# Patient Record
Sex: Female | Born: 1970 | Race: White | Hispanic: No | Marital: Married | Smoking: Former smoker
Health system: Southern US, Community
[De-identification: ages and names within clinical notes are randomized; demographics above are authoritative.]

## PROBLEM LIST (undated history)

## (undated) DIAGNOSIS — R002 Palpitations: Secondary | ICD-10-CM

## (undated) DIAGNOSIS — K219 Gastro-esophageal reflux disease without esophagitis: Secondary | ICD-10-CM

## (undated) DIAGNOSIS — N63 Unspecified lump in unspecified breast: Secondary | ICD-10-CM

## (undated) DIAGNOSIS — E079 Disorder of thyroid, unspecified: Secondary | ICD-10-CM

## (undated) DIAGNOSIS — F32A Depression, unspecified: Secondary | ICD-10-CM

## (undated) DIAGNOSIS — K8020 Calculus of gallbladder without cholecystitis without obstruction: Secondary | ICD-10-CM

## (undated) DIAGNOSIS — R42 Dizziness and giddiness: Secondary | ICD-10-CM

## (undated) DIAGNOSIS — F329 Major depressive disorder, single episode, unspecified: Secondary | ICD-10-CM

## (undated) DIAGNOSIS — F4310 Post-traumatic stress disorder, unspecified: Secondary | ICD-10-CM

## (undated) DIAGNOSIS — I10 Essential (primary) hypertension: Secondary | ICD-10-CM

## (undated) DIAGNOSIS — M62838 Other muscle spasm: Secondary | ICD-10-CM

## (undated) HISTORY — DX: Major depressive disorder, single episode, unspecified: F32.9

## (undated) HISTORY — DX: Calculus of gallbladder without cholecystitis without obstruction: K80.20

## (undated) HISTORY — DX: Gastro-esophageal reflux disease without esophagitis: K21.9

## (undated) HISTORY — DX: Palpitations: R00.2

## (undated) HISTORY — DX: Unspecified lump in unspecified breast: N63.0

## (undated) HISTORY — DX: Other muscle spasm: M62.838

## (undated) HISTORY — DX: Depression, unspecified: F32.A

## (undated) HISTORY — DX: Disorder of thyroid, unspecified: E07.9

---

## 2004-04-30 HISTORY — PX: BREAST BIOPSY: SHX20

## 2004-04-30 HISTORY — PX: BREAST LUMPECTOMY: SHX2

## 2004-04-30 HISTORY — PX: TUBAL LIGATION: SHX77

## 2005-01-06 ENCOUNTER — Observation Stay: Payer: Self-pay

## 2005-02-20 ENCOUNTER — Observation Stay: Payer: Self-pay

## 2005-02-21 ENCOUNTER — Ambulatory Visit: Payer: Self-pay

## 2005-02-23 ENCOUNTER — Observation Stay: Payer: Self-pay | Admitting: Unknown Physician Specialty

## 2005-03-14 ENCOUNTER — Observation Stay: Payer: Self-pay

## 2005-03-28 ENCOUNTER — Observation Stay: Payer: Self-pay

## 2005-04-16 ENCOUNTER — Observation Stay: Payer: Self-pay | Admitting: Obstetrics & Gynecology

## 2005-04-24 ENCOUNTER — Inpatient Hospital Stay: Payer: Self-pay

## 2005-05-31 ENCOUNTER — Emergency Department: Payer: Self-pay | Admitting: Emergency Medicine

## 2005-10-09 ENCOUNTER — Ambulatory Visit: Payer: Self-pay | Admitting: Unknown Physician Specialty

## 2006-04-13 ENCOUNTER — Emergency Department: Payer: Self-pay | Admitting: Emergency Medicine

## 2011-01-28 ENCOUNTER — Emergency Department: Payer: Self-pay | Admitting: Emergency Medicine

## 2011-01-31 ENCOUNTER — Ambulatory Visit: Payer: Self-pay

## 2011-05-20 ENCOUNTER — Emergency Department: Payer: Self-pay | Admitting: Emergency Medicine

## 2012-04-22 ENCOUNTER — Emergency Department: Payer: Self-pay | Admitting: Internal Medicine

## 2012-04-22 LAB — COMPREHENSIVE METABOLIC PANEL
Albumin: 3.8 g/dL (ref 3.4–5.0)
Anion Gap: 8 (ref 7–16)
BUN: 11 mg/dL (ref 7–18)
Chloride: 106 mmol/L (ref 98–107)
EGFR (African American): >60
Glucose: 98 mg/dL (ref 65–99)
Osmolality: 279 (ref 275–301)
Potassium: 3.8 mmol/L (ref 3.5–5.1)
SGOT(AST): 23 U/L (ref 15–37)
SGPT (ALT): 42 U/L (ref 12–78)
Sodium: 140 mmol/L (ref 136–145)
Total Protein: 7.6 g/dL (ref 6.4–8.2)

## 2012-04-22 LAB — CBC
MCH: 32.9 pg (ref 26.0–34.0)
MCHC: 35.5 g/dL (ref 32.0–36.0)
Platelet: 191 10*3/uL (ref 150–440)
RDW: 13 % (ref 11.5–14.5)
WBC: 7.3 10*3/uL (ref 3.6–11.0)

## 2012-04-22 LAB — URINALYSIS, COMPLETE
Nitrite: NEGATIVE
Ph: 6 (ref 4.5–8.0)
Protein: NEGATIVE
Specific Gravity: 1.027 (ref 1.003–1.030)
WBC UR: 1 /HPF (ref 0–5)

## 2012-04-22 LAB — LIPASE, BLOOD: Lipase: 90 U/L (ref 73–393)

## 2013-08-21 ENCOUNTER — Ambulatory Visit: Payer: Self-pay | Admitting: Family Medicine

## 2013-09-18 ENCOUNTER — Ambulatory Visit: Payer: Self-pay | Admitting: Family Medicine

## 2013-10-07 ENCOUNTER — Ambulatory Visit: Payer: Self-pay

## 2013-10-17 ENCOUNTER — Emergency Department: Payer: Self-pay | Admitting: Emergency Medicine

## 2013-10-17 LAB — CBC WITH DIFFERENTIAL/PLATELET
BASOS ABS: 0 10*3/uL (ref 0.0–0.1)
BASOS PCT: 0.3 %
EOS PCT: 0.2 %
Eosinophil #: 0 10*3/uL (ref 0.0–0.7)
HCT: 43.5 % (ref 35.0–47.0)
HGB: 15 g/dL (ref 12.0–16.0)
LYMPHS PCT: 8.9 %
Lymphocyte #: 1 10*3/uL (ref 1.0–3.6)
MCH: 31.6 pg (ref 26.0–34.0)
MCHC: 34.4 g/dL (ref 32.0–36.0)
MCV: 92 fL (ref 80–100)
MONO ABS: 0.5 x10 3/mm (ref 0.2–0.9)
Monocyte %: 4.5 %
NEUTROS ABS: 10 10*3/uL — AB (ref 1.4–6.5)
Neutrophil %: 86.1 %
PLATELETS: 219 10*3/uL (ref 150–440)
RBC: 4.74 10*6/uL (ref 3.80–5.20)
RDW: 13.3 % (ref 11.5–14.5)
WBC: 11.6 10*3/uL — AB (ref 3.6–11.0)

## 2013-10-17 LAB — URINALYSIS, COMPLETE
Bilirubin,UR: NEGATIVE
Glucose,UR: NEGATIVE mg/dL (ref 0–75)
LEUKOCYTE ESTERASE: NEGATIVE
NITRITE: NEGATIVE
PH: 5 (ref 4.5–8.0)
Protein: 30
RBC,UR: 1 /HPF (ref 0–5)
Specific Gravity: 1.032 (ref 1.003–1.030)
Squamous Epithelial: 3
WBC UR: 3 /HPF (ref 0–5)

## 2013-10-17 LAB — COMPREHENSIVE METABOLIC PANEL
ALK PHOS: 93 U/L
ALT: 25 U/L (ref 12–78)
ANION GAP: 10 (ref 7–16)
Albumin: 4.2 g/dL (ref 3.4–5.0)
BUN: 8 mg/dL (ref 7–18)
Bilirubin,Total: 0.7 mg/dL (ref 0.2–1.0)
Calcium, Total: 9.6 mg/dL (ref 8.5–10.1)
Chloride: 103 mmol/L (ref 98–107)
Co2: 23 mmol/L (ref 21–32)
Creatinine: 0.99 mg/dL (ref 0.60–1.30)
EGFR (Non-African Amer.): >60
Glucose: 83 mg/dL (ref 65–99)
OSMOLALITY: 269 (ref 275–301)
Potassium: 3.7 mmol/L (ref 3.5–5.1)
SGOT(AST): 24 U/L (ref 15–37)
Sodium: 136 mmol/L (ref 136–145)
Total Protein: 8 g/dL (ref 6.4–8.2)

## 2013-10-17 LAB — TROPONIN I

## 2013-10-19 ENCOUNTER — Encounter: Payer: Self-pay | Admitting: *Deleted

## 2013-11-02 ENCOUNTER — Encounter: Payer: Self-pay | Admitting: General Surgery

## 2013-11-02 ENCOUNTER — Ambulatory Visit (INDEPENDENT_AMBULATORY_CARE_PROVIDER_SITE_OTHER): Payer: Self-pay | Admitting: General Surgery

## 2013-11-02 VITALS — BP 140/80 | HR 72 | Resp 14 | Ht 65.0 in | Wt 234.0 lb

## 2013-11-02 DIAGNOSIS — K8020 Calculus of gallbladder without cholecystitis without obstruction: Secondary | ICD-10-CM

## 2013-11-02 NOTE — Patient Instructions (Signed)
Patient to follow up as needed. Patient to decide when and if she wants to have gallbladder surgery. The patient is aware to call back for any questions or concerns.

## 2013-11-02 NOTE — Progress Notes (Signed)
Patient ID: Gabriella BentSharmee E Yanik, female   DOB: 08/28/1970, 43 y.o.   MRN: [id number]  Chief Complaint  Patient presents with  . Other    gallstones    HPI Gabriella Chambers is a 43 y.o. female here today for a evaluation of gallstones.Patient had a ultrasound at [facility] on  10/17/13. Patient states she has been abdominal pain for years dating back to her teenage years, but her last attack in June 2015 was the most severe. The pain comes and goes. The pain is located near the bottom of her sternum and radiates to her right side. The patient states the pain doubled her over. The pain is described as a squeezing and stabbing pain. No triggers. She states she has nausea. The patient states she has a lot of gas and diarrhea but this is not new.  The patient has not had any recurrent episodes of severe pain.  HPI  Past Medical History  Diagnosis Date  . Thyroid disease   . GERD (gastroesophageal reflux disease)   . Depression   . Muscle spasm     Past Surgical History  Procedure Laterality Date  . Cesarean section  00,2006  . Tubal ligation  2006    Family History  Problem Relation Age of Onset  . Diabetes Father   . Heart disease Father     Social History History  Substance Use Topics  . Smoking status: Former Smoker -- 0.50 packs/day for 10 years    Types: Cigarettes  . Smokeless tobacco: Not on file  . Alcohol Use: No    Allergies  Allergen Reactions  . Codeine Hives and Nausea And Vomiting  . Neomycin Nausea And Vomiting    Current Outpatient Prescriptions  Medication Sig Dispense Refill  . clonazePAM (KLONOPIN) 1 MG tablet Take 1 tablet by mouth as needed.      . cyclobenzaprine (FLEXERIL) 10 MG tablet Take 10 mg by mouth as needed for muscle spasms.      Marland Kitchen. esomeprazole (NEXIUM) 40 MG capsule Take by mouth.      . levothyroxine (SYNTHROID, LEVOTHROID) 125 MCG tablet Take 1 tablet by mouth daily.      . promethazine (PHENERGAN) 25 MG tablet Take 25 mg by mouth every 6 (six)  hours as needed for nausea or vomiting.      . sertraline (ZOLOFT) 25 MG tablet Take 1 tablet by mouth daily.       No current facility-administered medications for this visit.    Review of Systems Review of Systems  Constitutional: Negative.   Respiratory: Negative.   Cardiovascular: Negative.   Gastrointestinal: Positive for nausea and abdominal pain.    Blood pressure 140/80, pulse 72, resp. rate 14, height 5\' 5"  (1.651 m), weight 234 lb (106.142 kg), last menstrual period 10/14/2013.  Physical Exam Physical Exam  Constitutional: She is oriented to person, place, and time. She appears well-developed and well-nourished.  Neck: Neck supple. No thyromegaly present.  Cardiovascular: Normal rate, regular rhythm and normal heart sounds.   No murmur heard. Pulmonary/Chest: Effort normal and breath sounds normal.  Abdominal: Soft. Normal appearance and bowel sounds are normal. There is no hepatosplenomegaly. There is no tenderness. No hernia.  Lymphadenopathy:    She has no cervical adenopathy.  Neurological: She is alert and oriented to person, place, and time.  Skin: Skin is warm and dry.    Data Reviewed Abdominal ultrasound dated October 17, 2013 showed evidence of cholelithiasis as well as sludge. No gallbladder wall  thickening. No sonographic Murphy sign.  CBC showed white blood cell count of 11,600 with 86% polys, 9% lymphocytes. Hemoglobin 15.0. Contents of metabolic panel was normal. Liver function studies were normal.  Assessment    Symptomatic cholelithiasis.     Plan    The patient has mild residual discomfort but no clinical symptoms on exam.  Options for management were reviewed: 1) dietary avoidance of triggering foods such as prior fruits and salads versus 2) elective cholecystectomy. The plan for a cholecystectomy would typically be laparoscopic, with the option for open cholecystectomy if indicated. The risks associated with both treatment plans were reviewed.  Recent clinical evidence does not suggest an increased risk for an open procedure or common bile duct injury with emergency surgery, although this would be best to avoid if possible.  The patient is concerned about financial cost, and she was encouraged to this could be managed should she desire to proceed.  Plans are present will be followup based on her decision for surgical intervention.     PCP: Denman GeorgeKhan, Farah    Byrnett, Jeffrey W 11/02/2013, 9:24 PM

## 2014-03-01 ENCOUNTER — Encounter: Payer: Self-pay | Admitting: General Surgery

## 2014-03-20 ENCOUNTER — Emergency Department: Payer: Self-pay | Admitting: Emergency Medicine

## 2014-03-20 LAB — BASIC METABOLIC PANEL
ANION GAP: 8 (ref 7–16)
BUN: 9 mg/dL (ref 7–18)
CALCIUM: 8.1 mg/dL — AB (ref 8.5–10.1)
CO2: 26 mmol/L (ref 21–32)
CREATININE: 0.96 mg/dL (ref 0.60–1.30)
Chloride: 110 mmol/L — ABNORMAL HIGH (ref 98–107)
EGFR (Non-African Amer.): >60
GLUCOSE: 103 mg/dL — AB (ref 65–99)
OSMOLALITY: 286 (ref 275–301)
Potassium: 3.6 mmol/L (ref 3.5–5.1)
Sodium: 144 mmol/L (ref 136–145)

## 2014-03-20 LAB — CBC WITH DIFFERENTIAL/PLATELET
Basophil #: 0.1 10*3/uL (ref 0.0–0.1)
Basophil %: 0.9 %
EOS ABS: 0.1 10*3/uL (ref 0.0–0.7)
EOS PCT: 1.7 %
HCT: 42.3 % (ref 35.0–47.0)
HGB: 14 g/dL (ref 12.0–16.0)
Lymphocyte #: 1.7 10*3/uL (ref 1.0–3.6)
Lymphocyte %: 20 %
MCH: 30.9 pg (ref 26.0–34.0)
MCHC: 33.1 g/dL (ref 32.0–36.0)
MCV: 93 fL (ref 80–100)
Monocyte #: 0.4 x10 3/mm (ref 0.2–0.9)
Monocyte %: 5.3 %
NEUTROS ABS: 6 10*3/uL (ref 1.4–6.5)
Neutrophil %: 72.1 %
Platelet: 209 10*3/uL (ref 150–440)
RBC: 4.55 10*6/uL (ref 3.80–5.20)
RDW: 13.4 % (ref 11.5–14.5)
WBC: 8.4 10*3/uL (ref 3.6–11.0)

## 2014-03-20 LAB — TROPONIN I: Troponin-I: <0.02 ng/mL

## 2014-04-26 ENCOUNTER — Emergency Department: Payer: Self-pay | Admitting: Emergency Medicine

## 2014-06-28 ENCOUNTER — Emergency Department: Payer: Self-pay | Admitting: Emergency Medicine

## 2014-06-29 NOTE — Telephone Encounter (Signed)
L MOM to schedule f This encounter was created in error - please disregard.

## 2014-06-30 ENCOUNTER — Encounter: Payer: Self-pay | Admitting: *Deleted

## 2014-06-30 ENCOUNTER — Ambulatory Visit: Payer: Self-pay | Admitting: Cardiology

## 2014-07-13 ENCOUNTER — Emergency Department: Payer: Self-pay | Admitting: Emergency Medicine

## 2014-11-15 ENCOUNTER — Emergency Department: Payer: No Typology Code available for payment source

## 2014-11-15 ENCOUNTER — Emergency Department
Admission: EM | Admit: 2014-11-15 | Discharge: 2014-11-15 | Disposition: A | Discharge status: Home / Self Care | Payer: No Typology Code available for payment source | Attending: Emergency Medicine | Admitting: Emergency Medicine

## 2014-11-15 ENCOUNTER — Encounter: Payer: Self-pay | Admitting: Emergency Medicine

## 2014-11-15 DIAGNOSIS — Y92410 Unspecified street and highway as the place of occurrence of the external cause: Secondary | ICD-10-CM | POA: Diagnosis not present

## 2014-11-15 DIAGNOSIS — Y9389 Activity, other specified: Secondary | ICD-10-CM | POA: Insufficient documentation

## 2014-11-15 DIAGNOSIS — Z79899 Other long term (current) drug therapy: Secondary | ICD-10-CM | POA: Insufficient documentation

## 2014-11-15 DIAGNOSIS — Z87891 Personal history of nicotine dependence: Secondary | ICD-10-CM | POA: Insufficient documentation

## 2014-11-15 DIAGNOSIS — Y998 Other external cause status: Secondary | ICD-10-CM | POA: Insufficient documentation

## 2014-11-15 DIAGNOSIS — S134XXA Sprain of ligaments of cervical spine, initial encounter: Secondary | ICD-10-CM | POA: Insufficient documentation

## 2014-11-15 DIAGNOSIS — S199XXA Unspecified injury of neck, initial encounter: Secondary | ICD-10-CM | POA: Diagnosis present

## 2014-11-15 MED ORDER — CYCLOBENZAPRINE HCL 10 MG PO TABS: 10.0000 mg | ORAL_TABLET | Freq: Three times a day (TID) | ORAL | Status: DC | PRN

## 2014-11-15 MED ORDER — CYCLOBENZAPRINE HCL 10 MG PO TABS
ORAL_TABLET | ORAL | Status: AC
  Administered 2014-11-15: 10 mg via ORAL
  Filled 2014-11-15: qty 1

## 2014-11-15 MED ORDER — CYCLOBENZAPRINE HCL 10 MG PO TABS
10.0000 mg | ORAL_TABLET | Freq: Once | ORAL | Status: AC
  Administered 2014-11-15: 10 mg via ORAL
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
## 2014-11-15 NOTE — ED Notes (Signed)

## 2014-11-15 NOTE — ED Notes (Signed)
Dr. Brown returns to bedside to speak with patient regarding results and discharge plan of care.  
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
## 2014-11-15 NOTE — Discharge Instructions (Signed)

## 2014-12-02 ENCOUNTER — Emergency Department
Admission: EM | Admit: 2014-12-02 | Discharge: 2014-12-02 | Disposition: A | Discharge status: Home / Self Care | Payer: No Typology Code available for payment source | Attending: Emergency Medicine | Admitting: Emergency Medicine

## 2014-12-02 ENCOUNTER — Emergency Department: Payer: No Typology Code available for payment source

## 2014-12-02 DIAGNOSIS — Z79899 Other long term (current) drug therapy: Secondary | ICD-10-CM | POA: Insufficient documentation

## 2014-12-02 DIAGNOSIS — R112 Nausea with vomiting, unspecified: Secondary | ICD-10-CM | POA: Diagnosis present

## 2014-12-02 DIAGNOSIS — Z79811 Long term (current) use of aromatase inhibitors: Secondary | ICD-10-CM | POA: Diagnosis not present

## 2014-12-02 DIAGNOSIS — R42 Dizziness and giddiness: Secondary | ICD-10-CM | POA: Diagnosis not present

## 2014-12-02 DIAGNOSIS — H9313 Tinnitus, bilateral: Secondary | ICD-10-CM | POA: Diagnosis not present

## 2014-12-02 DIAGNOSIS — Z87891 Personal history of nicotine dependence: Secondary | ICD-10-CM | POA: Diagnosis not present

## 2014-12-02 LAB — COMPREHENSIVE METABOLIC PANEL
ALBUMIN: 3.8 g/dL (ref 3.5–5.0)
ALK PHOS: 88 U/L (ref 38–126)
ALT: 27 U/L (ref 14–54)
AST: 26 U/L (ref 15–41)
Anion gap: 9 (ref 5–15)
BUN: 14 mg/dL (ref 6–20)
CALCIUM: 8.7 mg/dL — AB (ref 8.9–10.3)
CHLORIDE: 106 mmol/L (ref 101–111)
CO2: 24 mmol/L (ref 22–32)
CREATININE: 0.89 mg/dL (ref 0.44–1.00)
GFR calc Af Amer: >60 mL/min (ref >60)
GFR calc non Af Amer: >60 mL/min (ref >60)
Glucose, Bld: 105 mg/dL — ABNORMAL HIGH (ref 65–99)
Potassium: 3.9 mmol/L (ref 3.5–5.1)
Sodium: 139 mmol/L (ref 135–145)
TOTAL PROTEIN: 7.3 g/dL (ref 6.5–8.1)
Total Bilirubin: 0.7 mg/dL (ref 0.3–1.2)

## 2014-12-02 LAB — CBC
HEMATOCRIT: 40.6 % (ref 35.0–47.0)
Hemoglobin: 13.2 g/dL (ref 12.0–16.0)
MCH: 28.9 pg (ref 26.0–34.0)
MCHC: 32.4 g/dL (ref 32.0–36.0)
MCV: 89.2 fL (ref 80.0–100.0)
PLATELETS: 174 10*3/uL (ref 150–440)
RBC: 4.55 MIL/uL (ref 3.80–5.20)
RDW: 14.8 % — AB (ref 11.5–14.5)
WBC: 12.3 10*3/uL — ABNORMAL HIGH (ref 3.6–11.0)

## 2014-12-02 LAB — LIPASE, BLOOD: Lipase: 31 U/L (ref 22–51)

## 2014-12-02 MED ORDER — MECLIZINE HCL 12.5 MG PO TABS: 12.5000 mg | ORAL_TABLET | Freq: Three times a day (TID) | ORAL | Status: DC | PRN

## 2014-12-02 MED ORDER — PROMETHAZINE HCL 25 MG/ML IJ SOLN
12.5000 mg | Freq: Once | INTRAMUSCULAR | Status: AC
  Administered 2014-12-02: 12.5 mg via INTRAVENOUS

## 2014-12-02 MED ORDER — DIAZEPAM 5 MG/ML IJ SOLN
2.5000 mg | Freq: Once | INTRAMUSCULAR | Status: AC
  Administered 2014-12-02: 2.5 mg via INTRAVENOUS
  Filled 2014-12-02: qty 2

## 2014-12-02 MED ORDER — MECLIZINE HCL 25 MG PO TABS
25.0000 mg | ORAL_TABLET | Freq: Once | ORAL | Status: AC
  Administered 2014-12-02: 25 mg via ORAL
  Filled 2014-12-02: qty 1

## 2014-12-02 MED ORDER — SODIUM CHLORIDE 0.9 % IV BOLUS (SEPSIS)
1000.0000 mL | Freq: Once | INTRAVENOUS | Status: AC
  Administered 2014-12-02: 1000 mL via INTRAVENOUS

## 2014-12-02 NOTE — ED Notes (Signed)
Pt presents to ED via [facility] with c/o N/V/D since 5am this morning. Per EMS, pt woke up with said sx's; pt reports a h/x of vertigo. Per EMS, pt has vomited 3-4 times during transit.

## 2014-12-02 NOTE — ED Provider Notes (Signed)
Gabriella Chambers presented to the emergency department with acute onset vertigo with nausea and vomiting.  She was seen by Dr. Manson Passey. Please see his H&P for full details.  Her initial blood test was considered to be diluted with a hemoglobin level of 5. The patient, according to Dr. Manson Passey, looks well except for her vertigo and had blood redrawn. She been treated with Phenergan twice and was improving.  The patient was signed out to me at 7:20 AM. I have just reexamined the patient. She is feeling significantly better, though still appears anxious about the severity of her symptoms earlier. Blood tests and the CT head that Dr. Manson Passey and ordered are still pending.  Neuro exam: Patient is alert and communicative. She is able to sit up and move her head without distress. She has equal grip strength bilaterally and 5 over 5 strength in all 4 extremities. She has good finger to nose function. She has normal extraocular movement.  ----------------------------------------- 9:34 AM on 12/02/2014 -----------------------------------------   Labs Reviewed  CBC - Abnormal; Notable for the following:    RBC 1.80 (*)    Hemoglobin 5.3 (*)    HCT 16.4 (*)    Platelets 68 (*)    All other components within normal limits  COMPREHENSIVE METABOLIC PANEL - Abnormal; Notable for the following:    Glucose, Bld 105 (*)    Calcium 8.7 (*)    All other components within normal limits  CBC - Abnormal; Notable for the following:    WBC 12.3 (*)    RDW 14.8 (*)    All other components within normal limits  LIPASE, BLOOD   CT head: Negative CT of the head.  ~~~~~~~~~~~~~~~~~~~~~  At this time the patient is comfortable and ambulatory. We will discharge her to follow-up with her primary physician, Dr. Marvis Moeller.  Darien Ramus, MD 12/02/14 [phone]

## 2014-12-02 NOTE — ED Notes (Signed)
Patient transported to CT 
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
## 2014-12-02 NOTE — Discharge Instructions (Signed)
Benign Positional Vertigo Vertigo means you feel like you or your surroundings are moving when they are not. Benign positional vertigo is the most common form of vertigo. Benign means that the cause of your condition is not serious. Benign positional vertigo is more common in older adults. CAUSES  Benign positional vertigo is the result of an upset in the labyrinth system. This is an area in the middle ear that helps control your balance. This may be caused by a viral infection, head injury, or repetitive motion. However, often no specific cause is found. SYMPTOMS  Symptoms of benign positional vertigo occur when you move your head or eyes in different directions. Some of the symptoms may include:  Loss of balance and falls.  Vomiting.  Blurred vision.  Dizziness.  Nausea.  Involuntary eye movements (nystagmus). DIAGNOSIS  Benign positional vertigo is usually diagnosed by physical exam. If the specific cause of your benign positional vertigo is unknown, your caregiver may perform imaging tests, such as magnetic resonance imaging (MRI) or computed tomography (CT). TREATMENT  Your caregiver may recommend movements or procedures to correct the benign positional vertigo. Medicines such as meclizine, benzodiazepines, and medicines for nausea may be used to treat your symptoms. In rare cases, if your symptoms are caused by certain conditions that affect the inner ear, you may need surgery. HOME CARE INSTRUCTIONS   Follow your caregiver's instructions.  Move slowly. Do not make sudden body or head movements.  Avoid driving.  Avoid operating heavy machinery.  Avoid performing any tasks that would be dangerous to you or others during a vertigo episode.  Drink enough fluids to keep your urine clear or pale yellow. SEEK IMMEDIATE MEDICAL CARE IF:   You develop problems with walking, weakness, numbness, or using your arms, hands, or legs.  You have difficulty speaking.  You develop  severe headaches.  Your nausea or vomiting continues or gets worse.  You develop visual changes.  Your family or friends notice any behavioral changes.  Your condition gets worse.  You have a fever.  You develop a stiff neck or sensitivity to light. MAKE SURE YOU:   Understand these instructions.  Will watch your condition.  Will get help right away if you are not doing well or get worse. Document Released: 01/22/2006 Document Revised: 07/09/2011 Document Reviewed: 01/04/2011 [employer] Patient Information 2015 [employer]. This information is not intended to replace advice given to you by your health care provider. Make sure you discuss any questions you have with your health care provider.    

## 2015-02-21 ENCOUNTER — Encounter: Payer: Self-pay | Admitting: *Deleted

## 2015-02-21 ENCOUNTER — Emergency Department
Admission: EM | Admit: 2015-02-21 | Discharge: 2015-02-21 | Disposition: A | Discharge status: Home / Self Care | Payer: Self-pay | Attending: Emergency Medicine | Admitting: Emergency Medicine

## 2015-02-21 DIAGNOSIS — Z87891 Personal history of nicotine dependence: Secondary | ICD-10-CM | POA: Insufficient documentation

## 2015-02-21 DIAGNOSIS — R42 Dizziness and giddiness: Secondary | ICD-10-CM | POA: Insufficient documentation

## 2015-02-21 DIAGNOSIS — R110 Nausea: Secondary | ICD-10-CM | POA: Insufficient documentation

## 2015-02-21 DIAGNOSIS — Z79899 Other long term (current) drug therapy: Secondary | ICD-10-CM | POA: Insufficient documentation

## 2015-02-21 DIAGNOSIS — F419 Anxiety disorder, unspecified: Secondary | ICD-10-CM | POA: Insufficient documentation

## 2015-02-21 LAB — COMPREHENSIVE METABOLIC PANEL
ALT: 41 U/L (ref 14–54)
AST: 31 U/L (ref 15–41)
Albumin: 3.9 g/dL (ref 3.5–5.0)
Alkaline Phosphatase: 84 U/L (ref 38–126)
Anion gap: 6 (ref 5–15)
BUN: 10 mg/dL (ref 6–20)
CO2: 23 mmol/L (ref 22–32)
CREATININE: 0.94 mg/dL (ref 0.44–1.00)
Calcium: 8.7 mg/dL — ABNORMAL LOW (ref 8.9–10.3)
Chloride: 109 mmol/L (ref 101–111)
GFR calc non Af Amer: >60 mL/min (ref >60)
GLUCOSE: 104 mg/dL — AB (ref 65–99)
Potassium: 3.8 mmol/L (ref 3.5–5.1)
SODIUM: 138 mmol/L (ref 135–145)
TOTAL PROTEIN: 7.3 g/dL (ref 6.5–8.1)
Total Bilirubin: 0.3 mg/dL (ref 0.3–1.2)

## 2015-02-21 LAB — CBC
HCT: 39.7 % (ref 35.0–47.0)
HEMOGLOBIN: 13.2 g/dL (ref 12.0–16.0)
MCH: 29.3 pg (ref 26.0–34.0)
MCHC: 33.2 g/dL (ref 32.0–36.0)
MCV: 88.2 fL (ref 80.0–100.0)
Platelets: 203 10*3/uL (ref 150–440)
RBC: 4.5 MIL/uL (ref 3.80–5.20)
RDW: 15 % — ABNORMAL HIGH (ref 11.5–14.5)
WBC: 8.7 10*3/uL (ref 3.6–11.0)

## 2015-02-21 LAB — TROPONIN I: Troponin I: <0.03 ng/mL (ref <0.031)

## 2015-02-21 NOTE — ED Provider Notes (Signed)
[facility] Emergency Department Provider Note  ____________________________________________  Time seen: on arrival  I have reviewed the triage vital signs and the nursing notes.   HISTORY  Chief Complaint Dizziness    HPI Gabriella Chambers is a 44 y.o. female who presents with complaints of dizziness. She reports the symptoms began earlier today at her son's football game. She reports she had not eaten all day and noticed that she felt slightly lightheaded. She took some meclizine because she has a history of vertigo and she was worried that was what the issue was. She also felt anxious because of all the people at the game. She developed mild nausea at the time and decided to come get checked out. She feels well now and has no dizziness or nausea. She did not have any chest pain. No shortness of breath. No abdominal pain     Past Medical History  Diagnosis Date  . Thyroid disease   . GERD (gastroesophageal reflux disease)   . Depression   . Muscle spasm   . Cholelithiasis     Patient Active Problem List   Diagnosis Date Noted  . Gallstones 11/02/2013    Past Surgical History  Procedure Laterality Date  . Cesarean section  00,2006  . Tubal ligation  2006    Current Outpatient Rx  Name  Route  Sig  Dispense  Refill  . acetaminophen (TYLENOL) 500 MG tablet   Oral   Take 1,000 mg by mouth every 6 (six) hours as needed.         Marland Kitchen. esomeprazole (NEXIUM) 40 MG capsule   Oral   Take 40 mg by mouth daily.          Marland Kitchen. levothyroxine (SYNTHROID, LEVOTHROID) 125 MCG tablet   Oral   Take 1 tablet by mouth daily.         . meclizine (ANTIVERT) 12.5 MG tablet   Oral   Take 1 tablet (12.5 mg total) by mouth 3 (three) times daily as needed for dizziness or nausea.   15 tablet   1   . clonazePAM (KLONOPIN) 1 MG tablet   Oral   Take 1 tablet by mouth as needed.         . cyclobenzaprine (FLEXERIL) 10 MG tablet   Oral   Take 1 tablet (10 mg  total) by mouth 3 (three) times daily as needed for muscle spasms.   30 tablet   0   . sertraline (ZOLOFT) 25 MG tablet   Oral   Take 1 tablet by mouth daily.           Allergies Codeine; Neomycin; and Vicodin  Family History  Problem Relation Age of Onset  . Diabetes Father   . Heart disease Father     Social History Social History  Substance Use Topics  . Smoking status: Former Smoker -- 0.50 packs/day for 10 years    Types: Cigarettes  . Smokeless tobacco: None  . Alcohol Use: No    Review of Systems  Constitutional: Negative for fever. Eyes: Negative for visual changes. ENT: Negative for sore throat Cardiovascular: Negative for chest pain. Respiratory: Negative for shortness of breath. Gastrointestinal: Negative for abdominal pain, vomiting and diarrhea. Genitourinary: Negative for dysuria. Musculoskeletal: Negative for back pain. Skin: Negative for rash. Neurological: dizziness, now resolved Psychiatric:mild anxiety    ____________________________________________   PHYSICAL EXAM:  VITAL SIGNS: ED Triage Vitals  Enc Vitals Group     BP 02/21/15 2041 157/88 mmHg  Pulse Rate 02/21/15 2041 87     Resp 02/21/15 2041 20     Temp 02/21/15 2041 98.8 F (37.1 C)     Temp Source 02/21/15 2041 Oral     SpO2 02/21/15 2041 100 %     Weight 02/21/15 2041 240 lb (108.863 kg)     Height 02/21/15 2041  (1.727 m)     Head Cir --      Peak Flow --      Pain Score 02/21/15 2052 0     Pain Loc --      Pain Edu? --      Excl. in GC? --      Constitutional: Alert and oriented. Well appearing and in no distress. Eyes: Conjunctivae are normal.  ENT   Head: Normocephalic and atraumatic.   Mouth/Throat: Mucous membranes are moist. Cardiovascular: Normal rate, regular rhythm. Normal and symmetric distal pulses are present in all extremities. No murmurs, rubs, or gallops. Respiratory: Normal respiratory effort without tachypnea nor retractions.  Breath sounds are clear and equal bilaterally.  Gastrointestinal: Soft and non-tender in all quadrants. No distention. There is no CVA tenderness. Genitourinary: deferred Musculoskeletal: Nontender with normal range of motion in all extremities. No lower extremity tenderness nor edema. Neurologic:  Normal speech and language. No gross focal neurologic deficits are appreciated. Skin:  Skin is warm, dry and intact. No rash noted. Psychiatric: Mood and affect are normal. Patient exhibits appropriate insight and judgment.  ____________________________________________    LABS (pertinent positives/negatives)  Labs Reviewed  CBC - Abnormal; Notable for the following:    RDW 15.0 (*)    All other components within normal limits  COMPREHENSIVE METABOLIC PANEL - Abnormal; Notable for the following:    Glucose, Bld 104 (*)    Calcium 8.7 (*)    All other components within normal limits  TROPONIN I    ____________________________________________   EKG  ED ECG REPORT I, Jene Every, the attending physician, personally viewed and interpreted this ECG.   Date: 02/21/2015  EKG Time: 8:45 PM  Rate: 82  Rhythm: normal EKG, normal sinus rhythm, normal sinus rhythm  Axis: normal  Intervals:QTC 483  ST&T Change: nonspecific   ____________________________________________    RADIOLOGY I have personally reviewed any xrays that were ordered on this patient: None  ____________________________________________   PROCEDURES  Procedure(s) performed: none  Critical Care performed: none  ____________________________________________   INITIAL IMPRESSION / ASSESSMENT AND PLAN / ED COURSE  Pertinent labs & imaging results that were available during my care of the patient were reviewed by me and considered in my medical decision making (see chart for details).  Patient well-appearing and in no distress.she has no symptoms in the emergency room. Her vital signs are reassuring. We will  check labs and EKG. Patient's symptoms are not consistent with ACS. She not has any dizziness.  Labs and EKG are reassuring. I have asked patient to follow-up with PCP. Return precautions discussed. I feel she is safe for discharge at this time.  ____________________________________________   FINAL CLINICAL IMPRESSION(S) / ED DIAGNOSES  Final diagnoses:  Dizziness     Jene Every, MD 02/21/15 [phone]

## 2015-02-21 NOTE — Discharge Instructions (Signed)

## 2015-02-21 NOTE — ED Notes (Signed)
Pt presents to ED with c/o dizziness, nausea, and "not feeling well". Pt reports being at her son's football game earlier this evening when the s/sx's started. Pt reports taking Meclizine PTA w/o relief. Pt states she was at the game and "surrounded by 500 people" and "felt anxious and dizzy and like I wanted to throw up but couldn't". Pt is A&O, in NAD, respirations regular, even and unlabored, with s/o at bedside.

## 2015-02-21 NOTE — ED Notes (Signed)
Pt ambulatory to triage.  Pt has vertigo.  Pt reports feeling dizzy today.  Pt took a meclizine without relief .  Pt continues to have dizziness. Pt alert.  Speech clear.

## 2016-04-03 ENCOUNTER — Encounter: Payer: Self-pay | Admitting: Emergency Medicine

## 2016-04-03 ENCOUNTER — Emergency Department: Payer: Self-pay

## 2016-04-03 ENCOUNTER — Emergency Department
Admission: EM | Admit: 2016-04-03 | Discharge: 2016-04-03 | Disposition: A | Discharge status: Home / Self Care | Payer: Self-pay | Attending: Emergency Medicine | Admitting: Emergency Medicine

## 2016-04-03 DIAGNOSIS — Z87891 Personal history of nicotine dependence: Secondary | ICD-10-CM | POA: Insufficient documentation

## 2016-04-03 DIAGNOSIS — J101 Influenza due to other identified influenza virus with other respiratory manifestations: Secondary | ICD-10-CM

## 2016-04-03 DIAGNOSIS — J09X2 Influenza due to identified novel influenza A virus with other respiratory manifestations: Secondary | ICD-10-CM | POA: Insufficient documentation

## 2016-04-03 LAB — COMPREHENSIVE METABOLIC PANEL
ALT: 82 U/L — ABNORMAL HIGH (ref 14–54)
AST: 97 U/L — ABNORMAL HIGH (ref 15–41)
Albumin: 3.9 g/dL (ref 3.5–5.0)
Alkaline Phosphatase: 124 U/L (ref 38–126)
Anion gap: 7 (ref 5–15)
BILIRUBIN TOTAL: 0.4 mg/dL (ref 0.3–1.2)
BUN: 10 mg/dL (ref 6–20)
CO2: 23 mmol/L (ref 22–32)
Calcium: 8.7 mg/dL — ABNORMAL LOW (ref 8.9–10.3)
Chloride: 110 mmol/L (ref 101–111)
Creatinine, Ser: 0.94 mg/dL (ref 0.44–1.00)
GFR calc Af Amer: >60 mL/min (ref >60)
Glucose, Bld: 112 mg/dL — ABNORMAL HIGH (ref 65–99)
POTASSIUM: 3.9 mmol/L (ref 3.5–5.1)
Sodium: 140 mmol/L (ref 135–145)
TOTAL PROTEIN: 7.5 g/dL (ref 6.5–8.1)

## 2016-04-03 LAB — URINALYSIS COMPLETE WITH MICROSCOPIC (ARMC ONLY)
Bilirubin Urine: NEGATIVE
Glucose, UA: NEGATIVE mg/dL
Hgb urine dipstick: NEGATIVE
KETONES UR: NEGATIVE mg/dL
LEUKOCYTES UA: NEGATIVE
Nitrite: NEGATIVE
PH: 6 (ref 5.0–8.0)
Protein, ur: NEGATIVE mg/dL
Specific Gravity, Urine: 1.013 (ref 1.005–1.030)

## 2016-04-03 LAB — CBC WITH DIFFERENTIAL/PLATELET
BASOS ABS: 0.1 10*3/uL (ref 0–0.1)
Basophils Relative: 3 %
Eosinophils Absolute: 0.1 10*3/uL (ref 0–0.7)
Eosinophils Relative: 2 %
HEMATOCRIT: 37.7 % (ref 35.0–47.0)
Hemoglobin: 12.7 g/dL (ref 12.0–16.0)
LYMPHS PCT: 9 %
Lymphs Abs: 0.4 10*3/uL — ABNORMAL LOW (ref 1.0–3.6)
MCH: 28.5 pg (ref 26.0–34.0)
MCHC: 33.7 g/dL (ref 32.0–36.0)
MCV: 84.4 fL (ref 80.0–100.0)
Monocytes Absolute: 0.5 10*3/uL (ref 0.2–0.9)
Monocytes Relative: 10 %
NEUTROS ABS: 3.7 10*3/uL (ref 1.4–6.5)
Neutrophils Relative %: 76 %
Platelets: 180 10*3/uL (ref 150–440)
RBC: 4.47 MIL/uL (ref 3.80–5.20)
RDW: 16.5 % — ABNORMAL HIGH (ref 11.5–14.5)
WBC: 4.8 10*3/uL (ref 3.6–11.0)

## 2016-04-03 LAB — INFLUENZA PANEL BY PCR (TYPE A & B)
INFLBPCR: NEGATIVE
Influenza A By PCR: POSITIVE — AB

## 2016-04-03 MED ORDER — OSELTAMIVIR PHOSPHATE 75 MG PO CAPS: 75.0000 mg | ORAL_CAPSULE | Freq: Two times a day (BID) | ORAL | 0 refills | Status: DC

## 2016-04-03 MED ORDER — HYDROCOD POLST-CPM POLST ER 10-8 MG/5ML PO SUER: 5.0000 mL | Freq: Two times a day (BID) | ORAL | 0 refills | Status: DC

## 2016-04-03 MED ORDER — OSELTAMIVIR PHOSPHATE 75 MG PO CAPS
75.0000 mg | ORAL_CAPSULE | Freq: Once | ORAL | Status: AC
  Administered 2016-04-03: 75 mg via ORAL
  Filled 2016-04-03: qty 1

## 2016-04-03 MED ORDER — PROMETHAZINE HCL 25 MG/ML IJ SOLN
25.0000 mg | Freq: Once | INTRAMUSCULAR | Status: AC
  Administered 2016-04-03: 25 mg via INTRAMUSCULAR
  Filled 2016-04-03: qty 1

## 2016-04-03 MED ORDER — HYDROCOD POLST-CPM POLST ER 10-8 MG/5ML PO SUER
5.0000 mL | Freq: Once | ORAL | Status: AC
  Administered 2016-04-03: 5 mL via ORAL
  Filled 2016-04-03: qty 5

## 2016-04-03 MED ORDER — PROMETHAZINE HCL 25 MG PO TABS: 25.0000 mg | ORAL_TABLET | Freq: Four times a day (QID) | ORAL | 0 refills | Status: DC | PRN

## 2016-04-03 NOTE — ED Triage Notes (Addendum)
Patient states that she developed a productive cough and congestion on Sunday. Patient reports that she started running a fever today. Patient reports that it was 100.5 at home. Patient states that she took tylenol at 3:00 am. Lung sounds clear.

## 2016-04-03 NOTE — ED Notes (Signed)
Pt discharged to home.  Family member driving.  Discharge instructions reviewed.  Verbalized understanding.  No questions or concerns at this time.  Teach back verified.  Pt in NAD.  No items left in ED.   

## 2016-04-03 NOTE — Discharge Instructions (Signed)
1. Start Tamiflu twice daily 5 days. 2. You may take Tussionex as needed for cough. 3. You may take Phenergan as needed for nausea/vomiting. 4. Drink plenty of fluids daily. 5. You may take Tylenol and/or ibuprofen as needed for body aches. 6. Return to the ER for worsening symptoms, persistent vomiting, difficulty breathing or other concerns.

## 2016-04-03 NOTE — ED Provider Notes (Signed)
[facility] Emergency Department Provider Note   ____________________________________________   First MD Initiated Contact with Patient 04/03/16 [id number]     (approximate)  I have reviewed the triage vital signs and the nursing notes.   HISTORY  Chief Complaint Cough; Nasal Congestion; and Fever    HPI Cyndia BentSharmee E Kilty is a 45 y.o. female who presents to the ED from home with a chief complaint of flu-like symptoms. Reports onset of symptoms 1.5 days ago. Complains of body aches, cough productive of yellow sputum, nasal congestion, and low-grade fever. Denies sick contacts. Denies chest pain, abdominal pain,  Vomiting, diarrhea. Denies recent travel or trauma. Nothing makes her symptoms better.   Past Medical History:  Diagnosis Date  . Cholelithiasis   . Depression   . GERD (gastroesophageal reflux disease)   . Muscle spasm   . Thyroid disease     Patient Active Problem List   Diagnosis Date Noted  . Gallstones 11/02/2013    Past Surgical History:  Procedure Laterality Date  . CESAREAN SECTION  00,2006  . TUBAL LIGATION  2006    Prior to Admission medications   Medication Sig Start Date End Date Taking? Authorizing Provider  acetaminophen (TYLENOL) 500 MG tablet Take 1,000 mg by mouth every 6 (six) hours as needed.    Historical Provider, MD  chlorpheniramine-HYDROcodone (TUSSIONEX PENNKINETIC ER) 10-8 MG/5ML SUER Take 5 mLs by mouth 2 (two) times daily. 04/03/16   Irean HongJade J Chalise Pe, MD  clonazePAM (KLONOPIN) 1 MG tablet Take 1 tablet by mouth as needed.    Historical Provider, MD  cyclobenzaprine (FLEXERIL) 10 MG tablet Take 1 tablet (10 mg total) by mouth 3 (three) times daily as needed for muscle spasms. 11/15/14   Darci Currentandolph N Brown, MD  esomeprazole (NEXIUM) 40 MG capsule Take 40 mg by mouth daily.     Historical Provider, MD  levothyroxine (SYNTHROID, LEVOTHROID) 125 MCG tablet Take 1 tablet by mouth daily.    Historical Provider, MD  meclizine  (ANTIVERT) 12.5 MG tablet Take 1 tablet (12.5 mg total) by mouth 3 (three) times daily as needed for dizziness or nausea. 12/02/14   Darien Ramusavid W Kaminski, MD  oseltamivir (TAMIFLU) 75 MG capsule Take 1 capsule (75 mg total) by mouth 2 (two) times daily. 04/03/16   Irean HongJade J Shia Delaine, MD  promethazine (PHENERGAN) 25 MG tablet Take 1 tablet (25 mg total) by mouth every 6 (six) hours as needed for nausea or vomiting. 04/03/16   Irean HongJade J Chasyn Cinque, MD  sertraline (ZOLOFT) 25 MG tablet Take 1 tablet by mouth daily.    Historical Provider, MD    Allergies Codeine; Neomycin; Vicodin [hydrocodone-acetaminophen]; and Zofran [ondansetron hcl]  Family History  Problem Relation Age of Onset  . Diabetes Father   . Heart disease Father     Social History Social History  Substance Use Topics  . Smoking status: Former Smoker    Packs/day: 0.50    Years: 10.00    Types: Cigarettes  . Smokeless tobacco: Never Used  . Alcohol use No    Review of Systems  Constitutional: Positive for fever and myalgias. Eyes: No visual changes. ENT: Positive for right earache. No sore throat. Cardiovascular: Denies chest pain. Respiratory: positive for productive cough.Denies shortness of breath. Gastrointestinal: No abdominal pain.  Positive for nausea, no vomiting.  No diarrhea.  No constipation. Genitourinary: Negative for dysuria. Musculoskeletal: Negative for back pain. Skin: Negative for rash. Neurological: Negative for headaches, focal weakness or numbness.  10-point ROS otherwise  negative.  ____________________________________________   PHYSICAL EXAM:  VITAL SIGNS: ED Triage Vitals  Enc Vitals Group     BP 04/03/16 0410 (!) 158/93     Pulse Rate 04/03/16 0410 99     Resp 04/03/16 0410 18     Temp 04/03/16 0410 (!) 100.5 F (38.1 C)     Temp Source 04/03/16 0410 Oral     SpO2 04/03/16 0410 98 %     Weight 04/03/16 0411 240 lb (108.9 kg)     Height 04/03/16 0411 5\' 6"  (1.676 m)     Head Circumference --       Peak Flow --      Pain Score --      Pain Loc --      Pain Edu? --      Excl. in GC? --     Constitutional: Alert and oriented. Well appearing and in mild acute distress. Eyes: Conjunctivae are normal. PERRL. EOMI. Head: Atraumatic. Ears: Right TM dull. Left TM within normal limits. Nose: Congestion/rhinnorhea. Mouth/Throat: Mucous membranes are moist.  Oropharynx non-erythematous. Neck: No stridor.  Supple neck without meningismus. Hematological/Lymphatic/Immunilogical: No cervical lymphadenopathy. Cardiovascular: Normal rate, regular rhythm. Grossly normal heart sounds.  Good peripheral circulation. Respiratory: Normal respiratory effort.  No retractions. Lungs CTAB. Gastrointestinal: Soft and nontender. No distention. No abdominal bruits. No CVA tenderness. Musculoskeletal: No lower extremity tenderness nor edema.  No joint effusions. Neurologic:  Normal speech and language. No gross focal neurologic deficits are appreciated. No gait instability. Skin:  Skin is warm, dry and intact. No rash noted. No petechiae. Psychiatric: Mood and affect are normal. Speech and behavior are normal.  ____________________________________________   LABS (all labs ordered are listed, but only abnormal results are displayed)  Labs Reviewed  COMPREHENSIVE METABOLIC PANEL - Abnormal; Notable for the following:       Result Value   Glucose, Bld 112 (*)    Calcium 8.7 (*)    AST 97 (*)    ALT 82 (*)    All other components within normal limits  CBC WITH DIFFERENTIAL/PLATELET - Abnormal; Notable for the following:    RDW 16.5 (*)    Lymphs Abs 0.4 (*)    All other components within normal limits  URINALYSIS COMPLETEWITH MICROSCOPIC ([facility] ONLY) - Abnormal; Notable for the following:    Color, Urine YELLOW (*)    APPearance CLEAR (*)    Bacteria, UA MANY (*)    Squamous Epithelial / LPF 0-5 (*)    All other components within normal limits  INFLUENZA PANEL BY PCR (TYPE A & B, H1N1) - Abnormal;  Notable for the following:    Influenza A By PCR POSITIVE (*)    All other components within normal limits   ____________________________________________  EKG  None ____________________________________________  RADIOLOGY  Chest 2 view (viewed by me, interpreted per Dr. Clovis Riley): No active cardiopulmonary disease. ____________________________________________   PROCEDURES  Procedure(s) performed: None  Procedures  Critical Care performed: No  ____________________________________________   INITIAL IMPRESSION / ASSESSMENT AND PLAN / ED COURSE  Pertinent labs & imaging results that were available during my care of the patient were reviewed by me and considered in my medical decision making (see chart for details).  45 year old female who presents with flulike symptoms. Positive for influenza A. Phenergan IM given for nausea. Will start her on Tamiflu, Tussionex as needed for cough and she will follow up with her PCP this week. Strict return precautions given. Patient and spouse verbalize understanding and  agree with plan of care.  Clinical Course      ____________________________________________   FINAL CLINICAL IMPRESSION(S) / ED DIAGNOSES  Final diagnoses:  Influenza A      NEW MEDICATIONS STARTED DURING THIS VISIT:  New Prescriptions   CHLORPHENIRAMINE-HYDROCODONE (TUSSIONEX PENNKINETIC ER) 10-8 MG/5ML SUER    Take 5 mLs by mouth 2 (two) times daily.   OSELTAMIVIR (TAMIFLU) 75 MG CAPSULE    Take 1 capsule (75 mg total) by mouth 2 (two) times daily.   PROMETHAZINE (PHENERGAN) 25 MG TABLET    Take 1 tablet (25 mg total) by mouth every 6 (six) hours as needed for nausea or vomiting.     Note:  This document was prepared using Dragon voice recognition software and may include unintentional dictation errors.    Irean HongJade J Dwayna Kentner, MD 04/03/16 [phone]

## 2016-08-01 ENCOUNTER — Encounter: Payer: Self-pay | Admitting: Emergency Medicine

## 2016-08-01 ENCOUNTER — Emergency Department: Payer: Self-pay

## 2016-08-01 ENCOUNTER — Emergency Department
Admission: EM | Admit: 2016-08-01 | Discharge: 2016-08-01 | Disposition: A | Discharge status: Home / Self Care | Payer: Self-pay | Attending: Emergency Medicine | Admitting: Emergency Medicine

## 2016-08-01 DIAGNOSIS — R079 Chest pain, unspecified: Secondary | ICD-10-CM | POA: Insufficient documentation

## 2016-08-01 DIAGNOSIS — M542 Cervicalgia: Secondary | ICD-10-CM | POA: Insufficient documentation

## 2016-08-01 DIAGNOSIS — Z87891 Personal history of nicotine dependence: Secondary | ICD-10-CM | POA: Insufficient documentation

## 2016-08-01 HISTORY — DX: Dizziness and giddiness: R42

## 2016-08-01 HISTORY — DX: Post-traumatic stress disorder, unspecified: F43.10

## 2016-08-01 LAB — BASIC METABOLIC PANEL
Anion gap: 7 (ref 5–15)
BUN: 10 mg/dL (ref 6–20)
CALCIUM: 9 mg/dL (ref 8.9–10.3)
CHLORIDE: 103 mmol/L (ref 101–111)
CO2: 26 mmol/L (ref 22–32)
CREATININE: 0.99 mg/dL (ref 0.44–1.00)
Glucose, Bld: 92 mg/dL (ref 65–99)
Potassium: 3.9 mmol/L (ref 3.5–5.1)
SODIUM: 136 mmol/L (ref 135–145)

## 2016-08-01 LAB — TROPONIN I

## 2016-08-01 LAB — CBC
HCT: 38.1 % (ref 35.0–47.0)
Hemoglobin: 12.4 g/dL (ref 12.0–16.0)
MCH: 26.8 pg (ref 26.0–34.0)
MCHC: 32.5 g/dL (ref 32.0–36.0)
MCV: 82.6 fL (ref 80.0–100.0)
PLATELETS: 233 10*3/uL (ref 150–440)
RBC: 4.61 MIL/uL (ref 3.80–5.20)
RDW: 16.7 % — AB (ref 11.5–14.5)
WBC: 6.9 10*3/uL (ref 3.6–11.0)

## 2016-08-01 NOTE — ED Triage Notes (Signed)
Pt presents to ED with c/o sharp left sided chest pain earlier today while going to a counseling appt for a recent dx of PTSD. Pt states she thinks she had a panic attack. Reports temporary relief from her symptoms after taking a Valium around 1300. Pain has now radiated from her chest to the front part of her neck. Denies chest pain at this time. No sob or nausea reported.

## 2016-08-01 NOTE — ED Provider Notes (Signed)
[facility] Emergency Department Provider Note   ____________________________________________   I have reviewed the triage vital signs and the nursing notes.   HISTORY  Chief Complaint Chest Pain   History limited by: Not Limited   HPI Gabriella Chambers is a 46 y.o. female who presents to the emergency department today because of concerns for chest and neck pain. Patient states she has a long history of anxiety and panic attacks. Today she went to go see a Therapist, nutritional. She stated that this in itself was a stressful situation. She was placed in a small room and then talk to somebody over a screen. She states she had developed some chest pain which she attributed to panic attacks. She also had some pain in her neck. She denies any small history of heart disease although has had some in her family. She does not have hypertension, diabetes or cholesterol issues. She is a nonsmoker.   Past Medical History:  Diagnosis Date  . Cholelithiasis   . Depression   . GERD (gastroesophageal reflux disease)   . Muscle spasm   . PTSD (post-traumatic stress disorder)   . Thyroid disease   . Vertigo     Patient Active Problem List   Diagnosis Date Noted  . Gallstones 11/02/2013    Past Surgical History:  Procedure Laterality Date  . CESAREAN SECTION  00,2006  . TUBAL LIGATION  2006    Prior to Admission medications   Medication Sig Start Date End Date Taking? Authorizing Provider  acetaminophen (TYLENOL) 500 MG tablet Take 1,000 mg by mouth every 6 (six) hours as needed.    Historical Provider, MD  chlorpheniramine-HYDROcodone (TUSSIONEX PENNKINETIC ER) 10-8 MG/5ML SUER Take 5 mLs by mouth 2 (two) times daily. 04/03/16   Irean Hong, MD  clonazePAM (KLONOPIN) 1 MG tablet Take 1 tablet by mouth as needed.    Historical Provider, MD  cyclobenzaprine (FLEXERIL) 10 MG tablet Take 1 tablet (10 mg total) by mouth 3 (three) times daily as needed for muscle spasms. 11/15/14    Darci Current, MD  esomeprazole (NEXIUM) 40 MG capsule Take 40 mg by mouth daily.     Historical Provider, MD  levothyroxine (SYNTHROID, LEVOTHROID) 125 MCG tablet Take 1 tablet by mouth daily.    Historical Provider, MD  meclizine (ANTIVERT) 12.5 MG tablet Take 1 tablet (12.5 mg total) by mouth 3 (three) times daily as needed for dizziness or nausea. 12/02/14   Darien Ramus, MD  oseltamivir (TAMIFLU) 75 MG capsule Take 1 capsule (75 mg total) by mouth 2 (two) times daily. 04/03/16   Irean Hong, MD  promethazine (PHENERGAN) 25 MG tablet Take 1 tablet (25 mg total) by mouth every 6 (six) hours as needed for nausea or vomiting. 04/03/16   Irean Hong, MD  sertraline (ZOLOFT) 25 MG tablet Take 1 tablet by mouth daily.    Historical Provider, MD    Allergies Codeine; Neomycin; Vicodin [hydrocodone-acetaminophen]; and Zofran [ondansetron hcl]  Family History  Problem Relation Age of Onset  . Diabetes Father   . Heart disease Father     Social History Social History  Substance Use Topics  . Smoking status: Former Smoker    Packs/day: 0.50    Years: 18.00    Types: Cigarettes  . Smokeless tobacco: Never Used  . Alcohol use No    Review of Systems  Constitutional: Negative for fever. Cardiovascular: Positive for chest pain. Respiratory: Negative for shortness of breath. Gastrointestinal: Negative  for abdominal pain, vomiting and diarrhea. Genitourinary: Negative for dysuria. Musculoskeletal: Negative for back pain. Skin: Negative for rash. Neurological: Negative for headaches, focal weakness or numbness. Psychiatric: Positive for anxiety and panic attacks.  10-point ROS otherwise negative.  ____________________________________________   PHYSICAL EXAM:  VITAL SIGNS: ED Triage Vitals  Enc Vitals Group     BP 08/01/16 1902 (!) 159/80     Pulse Rate 08/01/16 1902 88     Resp 08/01/16 1902 20     Temp 08/01/16 1902 98.5 F (36.9 C)     Temp Source 08/01/16 1902 Oral      SpO2 08/01/16 1902 100 %     Weight 08/01/16 1903 240 lb (108.9 kg)     Height 08/01/16 1903  (1.676 m)     Head Circumference --      Peak Flow --      Pain Score 08/01/16 1902 4    Constitutional: Alert and oriented.  Eyes: Conjunctivae are normal. Normal extraocular movements. ENT   Head: Normocephalic and atraumatic.   Nose: No congestion/rhinnorhea.   Mouth/Throat: Mucous membranes are moist.   Neck: No stridor. Hematological/Lymphatic/Immunilogical: No cervical lymphadenopathy. Cardiovascular: Normal rate, regular rhythm.  No murmurs, rubs, or gallops.  Respiratory: Normal respiratory effort without tachypnea nor retractions. Breath sounds are clear and equal bilaterally. No wheezes/rales/rhonchi. Gastrointestinal: Soft and non tender. No rebound. No guarding.  Genitourinary: Deferred Musculoskeletal: Normal range of motion in all extremities. No lower extremity edema. Neurologic:  Normal speech and language. No gross focal neurologic deficits are appreciated.  Skin:  Skin is warm, dry and intact. No rash noted. Psychiatric: Slightly anxious appearing.  ____________________________________________    LABS (pertinent positives/negatives)  Labs Reviewed  CBC - Abnormal; Notable for the following:       Result Value   RDW 16.7 (*)    All other components within normal limits  BASIC METABOLIC PANEL  TROPONIN I     ____________________________________________   EKG  I, Phineas Semen, attending physician, personally viewed and interpreted this EKG  EKG Time: 1904 Rate: 85 Rhythm: normal sinus rhythm Axis: left axis deviation Intervals: qtc 452 QRS: narrow ST changes: no st elevation Impression: abnormal ekg'   ____________________________________________    RADIOLOGY  CXR IMPRESSION:  No acute abnormalities.      ____________________________________________   PROCEDURES  Procedures  ____________________________________________   INITIAL IMPRESSION / ASSESSMENT AND PLAN / ED COURSE  Pertinent labs & imaging results that were available during my care of the patient were reviewed by me and considered in my medical decision making (see chart for details).  Patient presented to the emergency department today because of concerns for her chest and neck pain. Patient does have a significant history of panic attacks and anxiety. EKG and blood work here without concerning findings. This point I do think patient's symptoms likely related to anxiety. Patient is low risk for heart disease. Will plan on discharging to follow-up with primary care. Additionally given patient's concern for family heart history encouraged patient to follow up with her cardiologist.   ____________________________________________   FINAL CLINICAL IMPRESSION(S) / ED DIAGNOSES  Final diagnoses:  Nonspecific chest pain     Note: This dictation was prepared with Dragon dictation. Any transcriptional errors that result from this process are unintentional     Phineas Semen, MD 08/02/16 1614

## 2016-08-01 NOTE — Discharge Instructions (Signed)
Please seek medical attention for any high fevers, chest pain, shortness of breath, change in behavior, persistent vomiting, bloody stool or any other new or concerning symptoms.  

## 2016-08-01 NOTE — ED Notes (Signed)
Pt reports around 10:30am she was at an appt in [place] to see a counselor. Pt states she had never been there before which made her nervous and developed left side chest pain, "because I get nervous when I am in new places and I had never been there before." Pt reports they also put her in a small room and closed the door which "made me kind of panic." Pt states she has hx of panic attacks and verbalizes "it had gotten better however in the past 6 mths it's worse and the counselor said I need some medication." Pt states she took one  tablet of diazepam which gave her some relief.   Pt states she still has pain "more so on both sides of my neck." Denies chest pain at this time. Pt reports family hx of MI's. Pt denies SHOB and is A&O at this time.

## 2017-07-02 ENCOUNTER — Ambulatory Visit
Admission: EM | Admit: 2017-07-02 | Discharge: 2017-07-02 | Disposition: A | Discharge status: Home / Self Care | Payer: Self-pay | Attending: Family Medicine | Admitting: Family Medicine

## 2017-07-02 ENCOUNTER — Other Ambulatory Visit: Payer: Self-pay

## 2017-07-02 ENCOUNTER — Encounter: Payer: Self-pay | Admitting: Emergency Medicine

## 2017-07-02 DIAGNOSIS — R059 Cough, unspecified: Secondary | ICD-10-CM

## 2017-07-02 DIAGNOSIS — B9789 Other viral agents as the cause of diseases classified elsewhere: Secondary | ICD-10-CM

## 2017-07-02 DIAGNOSIS — R05 Cough: Secondary | ICD-10-CM

## 2017-07-02 DIAGNOSIS — J069 Acute upper respiratory infection, unspecified: Secondary | ICD-10-CM

## 2017-07-02 MED ORDER — AZITHROMYCIN 250 MG PO TABS
ORAL_TABLET | ORAL | 0 refills | Status: DC
Start: 2017-07-02 — End: 2021-05-27

## 2017-07-02 MED ORDER — BENZONATATE 200 MG PO CAPS: 200.0000 mg | ORAL_CAPSULE | Freq: Three times a day (TID) | ORAL | 0 refills | Status: DC | PRN

## 2017-07-02 NOTE — ED Triage Notes (Signed)
Patient  c/o cough and chest congestion for over a week.   

## 2017-07-02 NOTE — Discharge Instructions (Signed)
Take medication as prescribed. Rest. Drink plenty of fluids.  ° °Follow up with your primary care physician this week as needed. Return to Urgent care for new or worsening concerns.  ° °

## 2017-07-02 NOTE — ED Provider Notes (Signed)
[facility] ____________________________________________  Time seen: Approximately 6:51 PM  I have reviewed the triage vital signs and the nursing notes.   HISTORY  Chief Complaint Cough   HPI Gabriella Chambers is a 47 y.o. female presenting for evaluation of 1 week of cough and congestion symptoms.  Patient reports symptoms started last Tuesday quick in onset of cough, runny nose, nasal congestion and sore throat.  States sore throat has since resolved.  Patient reports this past weekend she was feeling better and thought that she was getting over her sickness, but reports symptoms have quickly worsened over the last few days.  States cough is productive of thick greenish mucus.  Denies hemoptysis.  States feels sore from coughing.  States has had sleep disrupted due to cough.  Continues to eat and drink well.  Denies any accompanying fevers, chills, body aches.  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  Denies current sore throat.  States unresolved with over-the-counter cough and decongestant medication.  Denies other aggravating or alleviating factors.  Denies recent sickness or recent antibiotic use. Denies chest pain, shortness of breath, abdominal pain, dysuria, extremity pain, extremity swelling or rash.  Gabriella Sato, MD: PCP Patient's last menstrual period was 06/16/2017 (approximate).Denies pregnancy   Past Medical History:  Diagnosis Date  . Cholelithiasis   . Depression   . GERD (gastroesophageal reflux disease)   . Muscle spasm   . PTSD (post-traumatic stress disorder)   . Thyroid disease   . Vertigo     Patient Active Problem List   Diagnosis Date Noted  . Gallstones 11/02/2013    Past Surgical History:  Procedure Laterality Date  . CESAREAN SECTION  00,2006  . TUBAL LIGATION  2006     No current facility-administered medications for this encounter.   Current Outpatient Medications:  .  levothyroxine (SYNTHROID,  LEVOTHROID) 125 MCG tablet, Take 1 tablet by mouth daily., Disp: , Rfl:  .  pantoprazole (PROTONIX) 20 MG tablet, Take 20 mg by mouth daily., Disp: , Rfl:  .  acetaminophen (TYLENOL) 500 MG tablet, Take 1,000 mg by mouth every 6 (six) hours as needed., Disp: , Rfl:  .  azithromycin (ZITHROMAX Z-PAK) 250 MG tablet, Take 2 tablets (500 mg) on  Day 1,  followed by 1 tablet (250 mg) once daily on Days 2 through 5., Disp: 6 each, Rfl: 0 .  benzonatate (TESSALON) 200 MG capsule, Take 1 capsule (200 mg total) by mouth 3 (three) times daily as needed for cough., Disp: 20 capsule, Rfl: 0  Allergies Codeine; Neomycin; Vicodin [hydrocodone-acetaminophen]; and Zofran [ondansetron hcl]  Family History  Problem Relation Age of Onset  . Diabetes Father   . Heart disease Father     Social History Social History   Tobacco Use  . Smoking status: Former Smoker    Packs/day: 0.50    Years: 18.00    Pack years: 9.00    Types: Cigarettes  . Smokeless tobacco: Never Used  Substance Use Topics  . Alcohol use: No  . Drug use: No    Review of Systems Constitutional: No fever/chills ENT: As above.  Cardiovascular: Denies chest pain. Respiratory: Denies shortness of breath. Gastrointestinal: No abdominal pain.  No nausea, no vomiting.  No diarrhea. Genitourinary: Negative for dysuria. Skin: Negative for rash.  ____________________________________________   PHYSICAL EXAM:  VITAL SIGNS: ED Triage Vitals  Enc Vitals Group     BP 07/02/17 1747 138/84  Pulse Rate 07/02/17 1747 85     Resp 07/02/17 1747 16     Temp 07/02/17 1747 98.5 F (36.9 C)     Temp Source 07/02/17 1747 Oral     SpO2 07/02/17 1747 100 %     Weight 07/02/17 1743 225 lb (102.1 kg)     Height 07/02/17 1743 5\' 6"  (1.676 m)     Head Circumference --      Peak Flow --      Pain Score 07/02/17 1743 0     Pain Loc --      Pain Edu? --      Excl. in GC? --     Constitutional: Alert and oriented. Well appearing and in no  acute distress. Eyes: Conjunctivae are normal. PERRL. EOMI. Head: Atraumatic. No sinus tenderness to palpation. No swelling. No erythema.  Ears: no erythema, normal TMs bilaterally.   Nose:Nasal congestion  Mouth/Throat: Mucous membranes are moist. No pharyngeal erythema. No tonsillar swelling or exudate.  Neck: No stridor.  No cervical spine tenderness to palpation. Hematological/Lymphatic/Immunilogical: No cervical lymphadenopathy. Cardiovascular: Normal rate, regular rhythm. Grossly normal heart sounds.  Good peripheral circulation. Respiratory: Normal respiratory effort.  No retractions.  No wheezes or rales.  Mild scattered rhonchi.  Dry intermittent cough noted in room.  Speaks in complete sentences.  Good air movement.  Musculoskeletal: Ambulatory with steady gait. No cervical, thoracic or lumbar tenderness to palpation. Neurologic:  Normal speech and language. No gait instability. Skin:  Skin appears warm, dry and intact. No rash noted. Psychiatric: Mood and affect are normal. Speech and behavior are normal.  ___________________________________________   LABS (all labs ordered are listed, but only abnormal results are displayed)  Labs Reviewed - No data to display  PROCEDURES Procedures    INITIAL IMPRESSION / ASSESSMENT AND PLAN / ED COURSE  Pertinent labs & imaging results that were available during my care of the patient were reviewed by me and considered in my medical decision making (see chart for details).  Overall well-appearing patient.  No acute distress.  Suspect recent viral upper respiratory infection, concern for secondary infection.  Will treat with oral azithromycin and PRN Tessalon Perles.  Discussed cough medication, however questionable codeine allergy and known hydrocodone allergy and will defer these types of cough medication medications, patient agrees.  Encourage rest, fluids, supportive care.Discussed indication, risks and benefits of medications with  patient.  Discussed follow up with Primary care physician this week. Discussed follow up and return parameters including no resolution or any worsening concerns. Patient verbalized understanding and agreed to plan.   ____________________________________________   FINAL CLINICAL IMPRESSION(S) / ED DIAGNOSES  Final diagnoses:  Viral URI with cough  Cough     ED Discharge Orders        Ordered    benzonatate (TESSALON) 200 MG capsule  3 times daily PRN     07/02/17 1848    azithromycin (ZITHROMAX Z-PAK) 250 MG tablet     07/02/17 1848       Note: This dictation was prepared with Dragon dictation along with smaller phrase technology. Any transcriptional errors that result from this process are unintentional.         Renford DillsMiller, Clance Baquero, NP 07/02/17 1954

## 2018-01-17 ENCOUNTER — Encounter: Payer: Self-pay | Admitting: Emergency Medicine

## 2018-01-17 ENCOUNTER — Emergency Department: Payer: Self-pay

## 2018-01-17 ENCOUNTER — Emergency Department
Admission: EM | Admit: 2018-01-17 | Discharge: 2018-01-17 | Disposition: A | Discharge status: Home / Self Care | Payer: Self-pay | Attending: Student in an Organized Health Care Education/Training Program | Admitting: Student in an Organized Health Care Education/Training Program

## 2018-01-17 DIAGNOSIS — R0602 Shortness of breath: Secondary | ICD-10-CM | POA: Insufficient documentation

## 2018-01-17 DIAGNOSIS — Z79899 Other long term (current) drug therapy: Secondary | ICD-10-CM | POA: Insufficient documentation

## 2018-01-17 DIAGNOSIS — E079 Disorder of thyroid, unspecified: Secondary | ICD-10-CM | POA: Insufficient documentation

## 2018-01-17 DIAGNOSIS — Z87891 Personal history of nicotine dependence: Secondary | ICD-10-CM | POA: Insufficient documentation

## 2018-01-17 DIAGNOSIS — R0789 Other chest pain: Secondary | ICD-10-CM | POA: Insufficient documentation

## 2018-01-17 DIAGNOSIS — R61 Generalized hyperhidrosis: Secondary | ICD-10-CM | POA: Insufficient documentation

## 2018-01-17 LAB — BASIC METABOLIC PANEL
ANION GAP: 6 (ref 5–15)
BUN: 10 mg/dL (ref 6–20)
CO2: 28 mmol/L (ref 22–32)
Calcium: 9 mg/dL (ref 8.9–10.3)
Chloride: 107 mmol/L (ref 98–111)
Creatinine, Ser: 0.9 mg/dL (ref 0.44–1.00)
GFR calc Af Amer: >60 mL/min (ref >60)
GLUCOSE: 117 mg/dL — AB (ref 70–99)
POTASSIUM: 4.2 mmol/L (ref 3.5–5.1)
Sodium: 141 mmol/L (ref 135–145)

## 2018-01-17 LAB — TROPONIN I
Troponin I: <0.03 ng/mL (ref <0.03)
Troponin I: <0.03 ng/mL (ref <0.03)

## 2018-01-17 LAB — HEPATIC FUNCTION PANEL
ALBUMIN: 4 g/dL (ref 3.5–5.0)
ALK PHOS: 65 U/L (ref 38–126)
ALT: 18 U/L (ref 0–44)
AST: 19 U/L (ref 15–41)
Bilirubin, Direct: <0.1 mg/dL (ref 0.0–0.2)
TOTAL PROTEIN: 7.3 g/dL (ref 6.5–8.1)
Total Bilirubin: 0.5 mg/dL (ref 0.3–1.2)

## 2018-01-17 LAB — CBC
HEMATOCRIT: 38.8 % (ref 35.0–47.0)
HEMOGLOBIN: 12.9 g/dL (ref 12.0–16.0)
MCH: 28.5 pg (ref 26.0–34.0)
MCHC: 33.2 g/dL (ref 32.0–36.0)
MCV: 85.7 fL (ref 80.0–100.0)
Platelets: 244 10*3/uL (ref 150–440)
RBC: 4.52 MIL/uL (ref 3.80–5.20)
RDW: 16.2 % — ABNORMAL HIGH (ref 11.5–14.5)
WBC: 6.7 10*3/uL (ref 3.6–11.0)

## 2018-01-17 MED ORDER — PROMETHAZINE HCL 25 MG PO TABS
25.0000 mg | ORAL_TABLET | Freq: Once | ORAL | Status: DC
  Filled 2018-01-17: qty 1

## 2018-01-17 MED ORDER — HYDROCODONE-ACETAMINOPHEN 5-325 MG PO TABS
1.0000 | ORAL_TABLET | Freq: Once | ORAL | Status: DC
  Filled 2018-01-17: qty 1

## 2018-01-17 MED ORDER — SUCRALFATE 1 G PO TABS: 1.0000 g | ORAL_TABLET | Freq: Two times a day (BID) | ORAL | 1 refills | Status: DC

## 2018-01-17 MED ORDER — ACETAMINOPHEN 500 MG PO TABS
1000.0000 mg | ORAL_TABLET | Freq: Once | ORAL | Status: AC
  Administered 2018-01-17: 1000 mg via ORAL
  Filled 2018-01-17: qty 2

## 2018-01-17 MED ORDER — SUCRALFATE 1 G PO TABS: 1.0000 g | ORAL_TABLET | Freq: Two times a day (BID) | ORAL | 1 refills | Status: AC

## 2018-01-17 NOTE — ED Provider Notes (Signed)
[facility] Emergency Department Provider Note    First MD Initiated Contact with Patient 01/17/18 [phone]     (approximate)  I have reviewed the triage vital signs and the nursing notes.   HISTORY  Chief Complaint Chest Pain    HPI Cyndia BentSharmee E Agudelo is a 47 y.o. female with past medical history as listed below presents the ER for evaluation of midsternal chest pain radiating to the left shoulder that awoke from sleep around 3 AM.  Did have some associated shortness of breath and diaphoresis but patient felt that she was very anxious and felt that may have been her reaction to the symptoms that she was having.  Did have diarrheal illness yesterday.  She says mild chest pain at this time.  Is never had pain like this before.  States it comes in waves and lasts roughly 3 minutes in duration.    Past Medical History:  Diagnosis Date  . Cholelithiasis   . Depression   . GERD (gastroesophageal reflux disease)   . Muscle spasm   . PTSD (post-traumatic stress disorder)   . Thyroid disease   . Vertigo    Family History  Problem Relation Age of Onset  . Diabetes Father   . Heart disease Father    Past Surgical History:  Procedure Laterality Date  . CESAREAN SECTION  00,2006  . TUBAL LIGATION  2006   Patient Active Problem List   Diagnosis Date Noted  . Gallstones 11/02/2013      Prior to Admission medications   Medication Sig Start Date End Date Taking? Authorizing Provider  levothyroxine (SYNTHROID, LEVOTHROID) 125 MCG tablet Take 1 tablet by mouth daily.   Yes [provider]  ranitidine (ZANTAC) 150 MG tablet Take 150 mg by mouth 2 (two) times daily.   Yes [provider]  acetaminophen (TYLENOL) 500 MG tablet Take 1,000 mg by mouth every 6 (six) hours as needed.    [provider]  azithromycin (ZITHROMAX Z-PAK) 250 MG tablet Take 2 tablets (500 mg) on  Day 1,  followed by 1 tablet (250 mg) once daily on Days 2 through 5.  07/02/17   Renford DillsMiller, Lindsey, NP  benzonatate (TESSALON) 200 MG capsule Take 1 capsule (200 mg total) by mouth 3 (three) times daily as needed for cough. 07/02/17   Renford DillsMiller, Lindsey, NP  pantoprazole (PROTONIX) 20 MG tablet Take 20 mg by mouth daily.    [provider]  sucralfate (CARAFATE) 1 g tablet Take 1 tablet (1 g total) by mouth 2 (two) times daily for 14 days. 01/17/18 01/31/18  Willy Eddyobinson, Goerge Mohr, MD    Allergies Codeine; Neomycin; Vicodin [hydrocodone-acetaminophen]; and Zofran [ondansetron hcl]    Social History Social History   Tobacco Use  . Smoking status: Former Smoker    Packs/day: 0.50    Years: 18.00    Pack years: 9.00    Types: Cigarettes  . Smokeless tobacco: Never Used  Substance Use Topics  . Alcohol use: No  . Drug use: No    Review of Systems Patient denies headaches, rhinorrhea, blurry vision, numbness, shortness of breath, chest pain, edema, cough, abdominal pain, nausea, vomiting, diarrhea, dysuria, fevers, rashes or hallucinations unless otherwise stated above in HPI. ____________________________________________   PHYSICAL EXAM:  VITAL SIGNS: Vitals:   01/17/18 1310 01/17/18 1348  BP: 126/78 132/80  Pulse: 62 67  Resp:    Temp:    SpO2: 100% 100%    Constitutional: Alert and oriented.  Eyes: Conjunctivae  are normal.  Head: Atraumatic. Nose: No congestion/rhinnorhea. Mouth/Throat: Mucous membranes are moist.   Neck: No stridor. Painless ROM.  Cardiovascular: Normal rate, regular rhythm. Grossly normal heart sounds.  Good peripheral circulation. Respiratory: Normal respiratory effort.  No retractions. Lungs CTAB. Gastrointestinal: Soft and nontender. No distention. No abdominal bruits. No CVA tenderness. Genitourinary:  Musculoskeletal: No lower extremity tenderness nor edema.  No joint effusions. Neurologic:  Normal speech and language. No gross focal neurologic deficits are appreciated. No facial droop Skin:  Skin is warm, dry and  intact. No rash noted. Psychiatric: Mood and affect are normal. Speech and behavior are normal.  ____________________________________________   LABS (all labs ordered are listed, but only abnormal results are displayed)  Results for orders placed or performed during the hospital encounter of 01/17/18 (from the past 24 hour(s))  Basic metabolic panel     Status: Abnormal   Collection Time: 01/17/18  9:22 AM  Result Value Ref Range   Sodium 141 135 - 145 mmol/L   Potassium 4.2 3.5 - 5.1 mmol/L   Chloride 107 98 - 111 mmol/L   CO2 28 22 - 32 mmol/L   Glucose, Bld 117 (H) 70 - 99 mg/dL   BUN 10 6 - 20 mg/dL   Creatinine, Ser 6.96 0.44 - 1.00 mg/dL   Calcium 9.0 8.9 - 29.5 mg/dL   GFR calc non Af Amer >60 >60 mL/min   GFR calc Af Amer >60 >60 mL/min   Anion gap 6 5 - 15  CBC     Status: Abnormal   Collection Time: 01/17/18  9:22 AM  Result Value Ref Range   WBC 6.7 3.6 - 11.0 K/uL   RBC 4.52 3.80 - 5.20 MIL/uL   Hemoglobin 12.9 12.0 - 16.0 g/dL   HCT 28.4 13.2 - 44.0 %   MCV 85.7 80.0 - 100.0 fL   MCH 28.5 26.0 - 34.0 pg   MCHC 33.2 32.0 - 36.0 g/dL   RDW 10.2 (H) 72.5 - 36.6 %   Platelets 244 150 - 440 K/uL  Troponin I     Status: None   Collection Time: 01/17/18  9:22 AM  Result Value Ref Range   Troponin I <0.03 <0.03 ng/mL  Hepatic function panel     Status: None   Collection Time: 01/17/18  9:22 AM  Result Value Ref Range   Total Protein 7.3 6.5 - 8.1 g/dL   Albumin 4.0 3.5 - 5.0 g/dL   AST 19 15 - 41 U/L   ALT 18 0 - 44 U/L   Alkaline Phosphatase 65 38 - 126 U/L   Total Bilirubin 0.5 0.3 - 1.2 mg/dL   Bilirubin, Direct <4.4 0.0 - 0.2 mg/dL   Indirect Bilirubin NOT CALCULATED 0.3 - 0.9 mg/dL  Troponin I     Status: None   Collection Time: 01/17/18 12:52 PM  Result Value Ref Range   Troponin I <0.03 <0.03 ng/mL   ____________________________________________  EKG My review and personal interpretation at Time: 8:36   Indication: chest pain  Rate: 80  Rhythm:  sinus Axis: normal Other: normal intervals, no stemi ____________________________________________  RADIOLOGY  I personally reviewed all radiographic images ordered to evaluate for the above acute complaints and reviewed radiology reports and findings.  These findings were personally discussed with the patient.  Please see medical record for radiology report.  ____________________________________________   PROCEDURES  Procedure(s) performed:  Procedures    Critical Care performed: no ____________________________________________   INITIAL IMPRESSION / ASSESSMENT AND  PLAN / ED COURSE  Pertinent labs & imaging results that were available during my care of the patient were reviewed by me and considered in my medical decision making (see chart for details).   DDX: ACS, pericarditis, esophagitis, boerhaaves, pe, dissection, pna, bronchitis, costochondritis   Merlinda E Oglesby is a 47 y.o. who presents to the ED with symptoms as described above.  Patient nontoxic-appearing.  She is anxious appearing however.  Afebrile hemodynamically stable.  Letter will be sent for the above differential.  EKG shows no evidence of acute ischemia.  Chest x-ray is reassuring.  Patient does not want anything for pain suggestive this is mild.  Patient low risk by heart score of 3 versus 4 therefore will order serial enzymes if initial troponin is negative to further risk stratify.  There is by Wells criteria and is PERC negative.  Clinical Course as of Jan 18 1403  Fri Jan 17, 2018  1052 Your initial troponin are negative and reassuring.  We will repeat enzyme to further stratify.   [PR]  1335 Patient reassessed.  Pain resolved.  Repeat troponin is negative.  This point do believe she stable and appropriate for outpatient follow-up.  States that she has been having worsening reflux and heartburn symptoms and would like to try something else for this.  Will trial Carafate.  Have discussed with the patient and  available family all diagnostics and treatments performed thus far and all questions were answered to the best of my ability. The patient demonstrates understanding and agreement with plan.    [PR]    Clinical Course User Index [PR] Willy Eddy, MD     As part of my medical decision making, I reviewed the following data within the electronic MEDICAL RECORD NUMBER Nursing notes reviewed and incorporated, Labs reviewed, notes from prior ED visits and [place] Controlled Substance Database   ____________________________________________   FINAL CLINICAL IMPRESSION(S) / ED DIAGNOSES  Final diagnoses:  Atypical chest pain      NEW MEDICATIONS STARTED DURING THIS VISIT:  Discharge Medication List as of 01/17/2018  1:37 PM    START taking these medications   Details  sucralfate (CARAFATE) 1 g tablet Take 1 tablet (1 g total) by mouth 2 (two) times daily for 14 days., Starting Fri 01/17/2018, Until Fri 01/31/2018, Normal         Note:  This document was prepared using Dragon voice recognition software and may include unintentional dictation errors.    Willy Eddy, MD 01/17/18 [phone]

## 2018-01-17 NOTE — ED Triage Notes (Signed)
Pt reports CP that woke her up this am. Pt states that when the pain comes she feels like she can not breathe. Pt reports pain radiates into her left shoulder.

## 2018-01-17 NOTE — ED Notes (Signed)
Pt's IV removed; VSS, no pain; given paperwork & educated; wheeled out to car.

## 2018-04-03 ENCOUNTER — Emergency Department: Payer: Self-pay

## 2018-04-03 ENCOUNTER — Encounter: Payer: Self-pay | Admitting: Emergency Medicine

## 2018-04-03 ENCOUNTER — Emergency Department
Admission: EM | Admit: 2018-04-03 | Discharge: 2018-04-03 | Disposition: A | Discharge status: Home / Self Care | Payer: Self-pay | Attending: Emergency Medicine | Admitting: Emergency Medicine

## 2018-04-03 ENCOUNTER — Other Ambulatory Visit: Payer: Self-pay

## 2018-04-03 DIAGNOSIS — Z79899 Other long term (current) drug therapy: Secondary | ICD-10-CM | POA: Insufficient documentation

## 2018-04-03 DIAGNOSIS — R0789 Other chest pain: Secondary | ICD-10-CM | POA: Insufficient documentation

## 2018-04-03 DIAGNOSIS — Z87891 Personal history of nicotine dependence: Secondary | ICD-10-CM | POA: Insufficient documentation

## 2018-04-03 LAB — BASIC METABOLIC PANEL
Anion gap: 11 (ref 5–15)
BUN: 14 mg/dL (ref 6–20)
CHLORIDE: 104 mmol/L (ref 98–111)
CO2: 24 mmol/L (ref 22–32)
Calcium: 9.1 mg/dL (ref 8.9–10.3)
Creatinine, Ser: 0.96 mg/dL (ref 0.44–1.00)
GFR calc non Af Amer: >60 mL/min (ref >60)
Glucose, Bld: 89 mg/dL (ref 70–99)
POTASSIUM: 3.3 mmol/L — AB (ref 3.5–5.1)
Sodium: 139 mmol/L (ref 135–145)

## 2018-04-03 LAB — POCT PREGNANCY, URINE: PREG TEST UR: NEGATIVE

## 2018-04-03 LAB — CBC
HEMATOCRIT: 40.7 % (ref 36.0–46.0)
HEMOGLOBIN: 13.2 g/dL (ref 12.0–15.0)
MCH: 27.8 pg (ref 26.0–34.0)
MCHC: 32.4 g/dL (ref 30.0–36.0)
MCV: 85.9 fL (ref 80.0–100.0)
NRBC: 0 % (ref 0.0–0.2)
Platelets: 259 10*3/uL (ref 150–400)
RBC: 4.74 MIL/uL (ref 3.87–5.11)
RDW: 15.7 % — ABNORMAL HIGH (ref 11.5–15.5)
WBC: 10.4 10*3/uL (ref 4.0–10.5)

## 2018-04-03 LAB — TROPONIN I
Troponin I: <0.03 ng/mL (ref <0.03)
Troponin I: <0.03 ng/mL (ref <0.03)

## 2018-04-03 NOTE — ED Notes (Signed)
This RN let patient up to bathroom, pt hooked back to monitor and back to bed without incident. [facility], POC preg test performed by this RN.

## 2018-04-03 NOTE — ED Notes (Signed)
Patient transported to X-ray 

## 2018-04-03 NOTE — ED Provider Notes (Addendum)
[facility] Emergency Department Provider Note  ____________________________________________   I have reviewed the triage vital signs and the nursing notes. Where available I have reviewed prior notes and, if possible and indicated, outside hospital notes.    HISTORY  Chief Complaint Chest Pain    HPI Gabriella Chambers is a 47 y.o. female  With a history of cholelithiasis remotely, history of chronic recurrent chest pain associated with her anxiety attacks for which she has been to a cardiologist twice and been told she does not have cardiac disease, she states that she is feeling very anxious although she has no SI or HI, she states that her life is a hectic situation, she has little many work stresses and she became very stressed and anxious today and had some chest pain very similar to multiple prior episodes of chest pain.  She states anytime she has chest pain it reminds her of her father, who she found after his cardiac arrest years ago and therefore anytime she has discomfort in her chest, in this case, it was accompanied by burping, she states she has a panic attack which she believes that she had today.  She states the pain was transitory, associated with burping did not radiate, no abdominal pain no vomiting no shortness of breath no pleuritic pain.  Patient has no personal family history of PE or DVT no recent travel no recent swelling to her leg she is not on any birth control no recent surgery and she is not pregnant she believes because she has had a BTL.  No longer has chest pain.  Her only complaint is that she continues to burp.  Pain was transitory, "achy", nonradiating, with no other associated symptoms, went away on its own and review of records show multiple prior visits for atypical chest pain to emergency departments over the last several years.    Past Medical History:  Diagnosis Date  . Cholelithiasis   . Depression   . GERD (gastroesophageal  reflux disease)   . Muscle spasm   . PTSD (post-traumatic stress disorder)   . Thyroid disease   . Vertigo     Patient Active Problem List   Diagnosis Date Noted  . Gallstones 11/02/2013    Past Surgical History:  Procedure Laterality Date  . CESAREAN SECTION  00,2006  . TUBAL LIGATION  2006    Prior to Admission medications   Medication Sig Start Date End Date Taking? Authorizing Provider  acetaminophen (TYLENOL) 500 MG tablet Take 1,000 mg by mouth every 6 (six) hours as needed.    [provider]  azithromycin (ZITHROMAX Z-PAK) 250 MG tablet Take 2 tablets (500 mg) on  Day 1,  followed by 1 tablet (250 mg) once daily on Days 2 through 5. 07/02/17   Renford DillsMiller, Lindsey, NP  benzonatate (TESSALON) 200 MG capsule Take 1 capsule (200 mg total) by mouth 3 (three) times daily as needed for cough. 07/02/17   Renford DillsMiller, Lindsey, NP  levothyroxine (SYNTHROID, LEVOTHROID) 125 MCG tablet Take 1 tablet by mouth daily.    [provider]  pantoprazole (PROTONIX) 20 MG tablet Take 20 mg by mouth daily.    [provider]  ranitidine (ZANTAC) 150 MG tablet Take 150 mg by mouth 2 (two) times daily.    [provider]  sucralfate (CARAFATE) 1 g tablet Take 1 tablet (1 g total) by mouth 2 (two) times daily for 14 days. 01/17/18 01/31/18  Willy Eddyobinson, Patrick, MD    Allergies Zofran [  ondansetron hcl]; Codeine; Neomycin; and Vicodin [hydrocodone-acetaminophen]  Family History  Problem Relation Age of Onset  . Diabetes Father   . Heart disease Father     Social History Social History   Tobacco Use  . Smoking status: Former Smoker    Packs/day: 0.50    Years: 18.00    Pack years: 9.00    Types: Cigarettes  . Smokeless tobacco: Never Used  Substance Use Topics  . Alcohol use: No  . Drug use: No    Review of Systems Constitutional: No fever/chills Eyes: No visual changes. ENT: No sore throat. No stiff neck no neck pain Cardiovascular: Denies chest  pain. Respiratory: Denies shortness of breath. Gastrointestinal:   no vomiting.  No diarrhea.  No constipation. Genitourinary: Negative for dysuria. Musculoskeletal: Negative lower extremity swelling Skin: Negative for rash. Neurological: Negative for severe headaches, focal weakness or numbness.   ____________________________________________   PHYSICAL EXAM:  VITAL SIGNS: ED Triage Vitals [04/03/18 1447]  Enc Vitals Group     BP (!) 145/77     Pulse Rate 86     Resp 16     Temp 97.8 F (36.6 C)     Temp Source Oral     SpO2 100 %     Weight 240 lb (108.9 kg)     Height 5\' 5"  (1.651 m)     Head Circumference      Peak Flow      Pain Score 0     Pain Loc      Pain Edu?      Excl. in GC?     Constitutional: Alert and oriented. Well appearing and in no acute distress. Eyes: Conjunctivae are normal Head: Atraumatic HEENT: No congestion/rhinnorhea. Mucous membranes are moist.  Oropharynx non-erythematous Neck:   Nontender with no meningismus, no masses, no stridor Cardiovascular: Normal rate, regular rhythm. Grossly normal heart sounds.  Good peripheral circulation. Respiratory: Normal respiratory effort.  No retractions. Lungs CTAB. Abdominal: Soft and nontender. No distention. No guarding no rebound Back:  There is no focal tenderness or step off.  there is no midline tenderness there are no lesions noted. there is no CVA tenderness Musculoskeletal: No lower extremity tenderness, no upper extremity tenderness. No joint effusions, no DVT signs strong distal pulses no edema Neurologic:  Normal speech and language. No gross focal neurologic deficits are appreciated.  Skin:  Skin is warm, dry and intact. No rash noted. Psychiatric: Mood and affect are anxious. Speech and behavior are normal.  ____________________________________________   LABS (all labs ordered are listed, but only abnormal results are displayed)  Labs Reviewed  BASIC METABOLIC PANEL - Abnormal; Notable  for the following components:      Result Value   Potassium 3.3 (*)    All other components within normal limits  CBC - Abnormal; Notable for the following components:   RDW 15.7 (*)    All other components within normal limits  TROPONIN I  POC URINE PREG, ED    Pertinent labs  results that were available during my care of the patient were reviewed by me and considered in my medical decision making (see chart for details). ____________________________________________  EKG  I personally interpreted any EKGs ordered by me or triage Normal sinus rhythm rate 80 bpm, no specific ST changes to suggest ischemia, normal axis. ____________________________________________  RADIOLOGY  Pertinent labs & imaging results that were available during my care of the patient were reviewed by me and considered in my medical decision  making (see chart for details). If possible, patient and/or family made aware of any abnormal findings.  Dg Chest 2 View  Result Date: 04/03/2018 CLINICAL DATA:  Palpitations.  Diaphoresis.  Lightheadedness. EXAM: CHEST - 2 VIEW COMPARISON:  01/17/2018. FINDINGS: Mediastinum hilar structures normal. Low lung volumes with mild bibasilar atelectasis. No pleural effusion or pneumothorax. Degenerative change and scoliosis thoracic spine. IMPRESSION: Low lung volumes with mild bibasilar atelectasis. Electronically Signed   By: Maisie Fus  Register   On: 04/03/2018 15:25   ____________________________________________    PROCEDURES  Procedure(s) performed: None  Procedures  Critical Care performed: None  ____________________________________________   INITIAL IMPRESSION / ASSESSMENT AND PLAN / ED COURSE  Pertinent labs & imaging results that were available during my care of the patient were reviewed by me and considered in my medical decision making (see chart for details).  With atypical chest pain associated with burping which is chronic and recurrent in nature,  associated with a panic attack as well.  Patient has no SI or HI but she is under a great deal of stress she states.  She is PERC negative, I do not think she has a PE.  She has no evidence of cardiogenic shock or dissection, abdomen is benign nothing to suggest intra-abdominal pathology such as gallbladder disease.  She does she state still have "gas" which she believes might be responsible for her pain but she wants to make sure.  She does have outpatient follow-up closely with GI.  We will send a second set of cardiac markers this is happened around 1:00 this afternoon we will reassess   ----------------------------------------- 7:02 PM on 04/03/2018 -----------------------------------------  As of cardiac markers are negative patient without any symptoms in the emergency department, chronic recurrent atypical chest pain associated with anxiety and burping, she and I talked about admission to the hospital her strong preference is to go home and I do not think this is unreasonable.  She does have close outpatient follow-up with cardiology already scheduled and I have advised her to try to advance that.  Extensive return precautions follow-up given and understood.  At this time, there does not appear to be clinical evidence to support the diagnosis of pulmonary embolus, dissection, myocarditis, endocarditis, pericarditis, pericardial tamponade, acute coronary syndrome, pneumothorax, pneumonia, or any other acute intrathoracic pathology that will require admission or acute intervention. Nor is there evidence of any significant intra-abdominal pathology causing this discomfort.   ____________________________________________   FINAL CLINICAL IMPRESSION(S) / ED DIAGNOSES  Final diagnoses:  None      This chart was dictated using voice recognition software.  Despite best efforts to proofread,  errors can occur which can change meaning.      Jeanmarie Plant, MD 04/03/18 1744    Jeanmarie Plant, MD 04/03/18 [phone]

## 2018-04-03 NOTE — ED Notes (Signed)
Patient verbalized understanding of discharge instructions, no questions. Patient ambulated out of ED with steady gait in no distress.  

## 2018-04-03 NOTE — ED Triage Notes (Signed)
Pt in via POV with complaints sudden onset palpitations while in a store, reports associated diaphoresis, light headedness.  Pt denies any pain.  NAD noted at this time.

## 2018-05-01 ENCOUNTER — Encounter: Payer: Self-pay | Admitting: *Deleted

## 2018-05-12 ENCOUNTER — Ambulatory Visit: Payer: Self-pay | Admitting: Internal Medicine

## 2018-05-12 NOTE — Progress Notes (Deleted)
New Outpatient Visit Date: 05/12/2018  Referring Provider: Dala Dock, PA [address] [place], [place] [address]  Chief Complaint: ***  HPI:  Ms. Throneberry is a 48 y.o. female who is being seen today for the evaluation of chest pain at the request of Ms. Jesse Sans. She has a history of depression, hyperlipidemia, hypothyroidism, and panic disorder.  She has a long history of atypical chest pain and was seen twice in the ED this fall.  She subsequently saw [facility] cardiology on 04/14/2018 due to recurrent chest pain as well as family history of heart disease in her parents (father with MI at age 88).  Exercise tolerance test was ordered.  --------------------------------------------------------------------------------------------------  Cardiovascular History & Procedures: Cardiovascular Problems:  Atypical chest pain  Risk Factors:  Hyperlipidemia and family history  Cath/PCI:  None  CV Surgery:  None  EP Procedures and Devices:  None  Non-Invasive Evaluation(s):  Exercise tolerance test (04/25/18, [facility]): Normal study without evidence of ischemia  Recent CV Pertinent Labs: Lab Results  Component Value Date   K 3.3 (L) 04/03/2018   K 3.6 03/20/2014   BUN 14 04/03/2018   BUN 9 03/20/2014   CREATININE 0.96 04/03/2018   CREATININE 0.96 03/20/2014    --------------------------------------------------------------------------------------------------  Past Medical History:  Diagnosis Date  . Breast mass   . Cholelithiasis   . Depression   . GERD (gastroesophageal reflux disease)   . Heart palpitations   . Muscle spasm   . PTSD (post-traumatic stress disorder)   . Thyroid disease    Hypothyroidism  . Vertigo     Past Surgical History:  Procedure Laterality Date  . BREAST LUMPECTOMY  2006  . CESAREAN SECTION  00,2006  . TUBAL LIGATION  2006    No outpatient medications have been marked as taking for the 05/12/18 encounter (Appointment)  with Ramsey Guadamuz, Cristal Deer, MD.    Allergies: Zofran [ondansetron hcl]; Codeine; Neomycin; and Vicodin [hydrocodone-acetaminophen]  Social History   Tobacco Use  . Smoking status: Former Smoker    Packs/day: 0.50    Years: 18.00    Pack years: 9.00    Types: Cigarettes  . Smokeless tobacco: Never Used  Substance Use Topics  . Alcohol use: No  . Drug use: No    Family History  Problem Relation Age of Onset  . Diabetes Father   . Heart disease Father     Review of Systems: A 12-system review of systems was performed and was negative except as noted in the HPI.  --------------------------------------------------------------------------------------------------  Physical Exam: There were no vitals taken for this visit.  General:  *** HEENT: No conjunctival pallor or scleral icterus. Moist mucous membranes. OP clear. Neck: Supple without lymphadenopathy, thyromegaly, JVD, or HJR. No carotid bruit. Lungs: Normal work of breathing. Clear to auscultation bilaterally without wheezes or crackles. Heart: Regular rate and rhythm without murmurs, rubs, or gallops. Non-displaced PMI. Abd: Bowel sounds present. Soft, NT/ND without hepatosplenomegaly Ext: No lower extremity edema. Radial, PT, and DP pulses are 2+ bilaterally Skin: Warm and dry without rash. Neuro: CNIII-XII intact. Strength and fine-touch sensation intact in upper and lower extremities bilaterally. Psych: Normal mood and affect.  EKG:  ***  Lab Results  Component Value Date   WBC 10.4 04/03/2018   HGB 13.2 04/03/2018   HCT 40.7 04/03/2018   MCV 85.9 04/03/2018   PLT 259 04/03/2018    Lab Results  Component Value Date   NA 139 04/03/2018   K 3.3 (L) 04/03/2018  CL 104 04/03/2018   CO2 24 04/03/2018   BUN 14 04/03/2018   CREATININE 0.96 04/03/2018   GLUCOSE 89 04/03/2018   ALT 18 01/17/2018    No results found for: CHOL, HDL, LDLCALC, LDLDIRECT, TRIG,  CHOLHDL   --------------------------------------------------------------------------------------------------  ASSESSMENT AND PLAN: ***  Yvonne Kendall, MD 05/12/2018 6:54 AM

## 2018-05-13 ENCOUNTER — Encounter: Payer: Self-pay | Admitting: Internal Medicine

## 2018-09-26 ENCOUNTER — Other Ambulatory Visit: Payer: Self-pay

## 2018-09-29 ENCOUNTER — Other Ambulatory Visit: Payer: Self-pay

## 2018-09-29 ENCOUNTER — Encounter: Payer: Self-pay | Admitting: *Deleted

## 2018-09-29 ENCOUNTER — Ambulatory Visit: Payer: Self-pay | Attending: Oncology | Admitting: *Deleted

## 2018-09-29 VITALS — BP 136/93 | HR 73 | Temp 96.3°F | Ht 66.0 in | Wt 242.3 lb

## 2018-09-29 DIAGNOSIS — N63 Unspecified lump in unspecified breast: Secondary | ICD-10-CM

## 2018-09-29 NOTE — Progress Notes (Signed)
  Subjective:     Patient ID: Gabriella Chambers, female   DOB: February 11, 1971, 48 y.o.   MRN: [id number]  HPI   Review of Systems     Objective:   Physical Exam Chest:     Breasts:        Right: Skin change present. No swelling, bleeding, inverted nipple, mass, nipple discharge or tenderness.        Left: Mass and skin change present. No swelling, bleeding, inverted nipple, nipple discharge or tenderness.       Comments: Bilateral inner quadrants with grainy glandular tissue Lymphadenopathy:     Upper Body:     Right upper body: No supraclavicular, axillary or pectoral adenopathy.     Left upper body: No supraclavicular, axillary or pectoral adenopathy.        Assessment:     48 year old White female presents with complaints of finding a breast mass last week on self exam.  States she went to her PCP at the [facility] and was told to call her for further financial assistance to get a mammogram.  On clinical breast exam I can palpate a tiny firm, mobile < 0.5 cm nodule at 12:00 1 cm from the areola.  Patient states she is not sure if this is the same area of concern, because she has not been able to feel it for the past few days.  Bilateral inner quadrants have a similar grainy glandular quality.  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  States her PCP is treating her for yeast, but it is not any better.  Encouraged her to call her PCP and inform her the rash is not responding her treatment.  Patient is due for her pap, but does not want to have that completed today. Risk Assessment    Risk Scores      09/29/2018   Last edited by: Scarlett Presto, RN   5-year risk: 1.4 %   Lifetime risk: 12.3 %              Plan:     Ordered bilateral diagnostic mammogram with ultrasound.  Called the [facility] to schedule patients mammogram.  No answer.  Left message for them to return my call.  Will call patient with appointment as soon as  possible.

## 2018-10-06 ENCOUNTER — Ambulatory Visit
Admission: RE | Admit: 2018-10-06 | Discharge: 2018-10-06 | Disposition: A | Discharge status: Home / Self Care | Payer: Self-pay | Source: Ambulatory Visit | Attending: Oncology | Admitting: Oncology

## 2018-10-06 ENCOUNTER — Other Ambulatory Visit: Payer: Self-pay | Admitting: *Deleted

## 2018-10-06 ENCOUNTER — Other Ambulatory Visit: Payer: Self-pay

## 2018-10-06 DIAGNOSIS — N63 Unspecified lump in unspecified breast: Secondary | ICD-10-CM

## 2018-10-13 ENCOUNTER — Ambulatory Visit
Admission: RE | Admit: 2018-10-13 | Discharge: 2018-10-13 | Disposition: A | Discharge status: Home / Self Care | Payer: Self-pay | Source: Ambulatory Visit | Attending: Oncology | Admitting: Oncology

## 2018-10-13 ENCOUNTER — Other Ambulatory Visit: Payer: Self-pay

## 2018-10-13 DIAGNOSIS — N63 Unspecified lump in unspecified breast: Secondary | ICD-10-CM | POA: Insufficient documentation

## 2018-10-14 LAB — SURGICAL PATHOLOGY

## 2018-10-15 ENCOUNTER — Encounter: Payer: Self-pay | Admitting: *Deleted

## 2018-10-15 NOTE — Progress Notes (Signed)
Talked to patient today.  States she did receive her biopsy results yesterday.  Patient can return to annual screening.  She request to go ahead and get her pap smear at this time.  She has been scheduled for 11/19/18 @ 11:00.

## 2018-11-03 ENCOUNTER — Encounter: Payer: Self-pay | Admitting: Emergency Medicine

## 2018-11-03 ENCOUNTER — Other Ambulatory Visit: Payer: Self-pay

## 2018-11-03 DIAGNOSIS — Z79899 Other long term (current) drug therapy: Secondary | ICD-10-CM | POA: Insufficient documentation

## 2018-11-03 DIAGNOSIS — R079 Chest pain, unspecified: Secondary | ICD-10-CM | POA: Insufficient documentation

## 2018-11-03 DIAGNOSIS — R002 Palpitations: Secondary | ICD-10-CM | POA: Insufficient documentation

## 2018-11-03 DIAGNOSIS — E039 Hypothyroidism, unspecified: Secondary | ICD-10-CM | POA: Insufficient documentation

## 2018-11-03 DIAGNOSIS — K2970 Gastritis, unspecified, without bleeding: Secondary | ICD-10-CM | POA: Insufficient documentation

## 2018-11-03 DIAGNOSIS — E876 Hypokalemia: Secondary | ICD-10-CM | POA: Insufficient documentation

## 2018-11-03 DIAGNOSIS — Z87891 Personal history of nicotine dependence: Secondary | ICD-10-CM | POA: Insufficient documentation

## 2018-11-03 LAB — CBC
HCT: 41.7 % (ref 36.0–46.0)
Hemoglobin: 14.2 g/dL (ref 12.0–15.0)
MCH: 30.5 pg (ref 26.0–34.0)
MCHC: 34.1 g/dL (ref 30.0–36.0)
MCV: 89.5 fL (ref 80.0–100.0)
Platelets: 269 10*3/uL (ref 150–400)
RBC: 4.66 MIL/uL (ref 3.87–5.11)
RDW: 14.3 % (ref 11.5–15.5)
WBC: 7.8 10*3/uL (ref 4.0–10.5)
nRBC: 0 % (ref 0.0–0.2)

## 2018-11-03 LAB — BASIC METABOLIC PANEL
Anion gap: 13 (ref 5–15)
BUN: 12 mg/dL (ref 6–20)
CO2: 25 mmol/L (ref 22–32)
Calcium: 9.5 mg/dL (ref 8.9–10.3)
Chloride: 103 mmol/L (ref 98–111)
Creatinine, Ser: 0.84 mg/dL (ref 0.44–1.00)
GFR calc Af Amer: >60 mL/min (ref >60)
GFR calc non Af Amer: >60 mL/min (ref >60)
Glucose, Bld: 103 mg/dL — ABNORMAL HIGH (ref 70–99)
Potassium: 2.9 mmol/L — ABNORMAL LOW (ref 3.5–5.1)
Sodium: 141 mmol/L (ref 135–145)

## 2018-11-03 LAB — TROPONIN I (HIGH SENSITIVITY): Troponin I (High Sensitivity): <2 ng/L (ref <18)

## 2018-11-03 LAB — POCT PREGNANCY, URINE: Preg Test, Ur: NEGATIVE

## 2018-11-03 NOTE — ED Triage Notes (Signed)
Pt c/o left sided, under breast pain that radiates upwards into the left side of neck. Pt reports that pain started this evening as she was driving home. EMS came out to home and encouraged pt to take 324mg  Asprin and to come to the ER for blood work. Pt denies N/V and SOB.

## 2018-11-04 ENCOUNTER — Emergency Department
Admission: EM | Admit: 2018-11-04 | Discharge: 2018-11-04 | Disposition: A | Discharge status: Home / Self Care | Payer: Self-pay | Attending: Emergency Medicine | Admitting: Emergency Medicine

## 2018-11-04 ENCOUNTER — Emergency Department: Payer: Self-pay

## 2018-11-04 DIAGNOSIS — E876 Hypokalemia: Secondary | ICD-10-CM

## 2018-11-04 DIAGNOSIS — R079 Chest pain, unspecified: Secondary | ICD-10-CM

## 2018-11-04 DIAGNOSIS — K2970 Gastritis, unspecified, without bleeding: Secondary | ICD-10-CM

## 2018-11-04 LAB — TROPONIN I (HIGH SENSITIVITY): Troponin I (High Sensitivity): 3 ng/L (ref <18)

## 2018-11-04 MED ORDER — POTASSIUM CHLORIDE CRYS ER 20 MEQ PO TBCR
40.0000 meq | EXTENDED_RELEASE_TABLET | Freq: Once | ORAL | Status: AC
  Administered 2018-11-04: 40 meq via ORAL
  Filled 2018-11-04: qty 2

## 2018-11-04 MED ORDER — SUCRALFATE 1 G PO TABS: 1.0000 g | ORAL_TABLET | Freq: Four times a day (QID) | ORAL | 0 refills | Status: AC

## 2018-11-04 NOTE — Discharge Instructions (Addendum)
Please seek medical attention for any high fevers, chest pain, shortness of breath, change in behavior, persistent vomiting, bloody stool or any other new or concerning symptoms.  

## 2018-11-04 NOTE — ED Provider Notes (Signed)
[facility] Emergency Department Provider Note   ____________________________________________   I have reviewed the triage vital signs and the nursing notes.   HISTORY  Chief Complaint Chest Pain   History limited by: Not Limited   HPI Gabriella Chambers is a 48 y.o. female who presents to the emergency department today because of concern for chest pain. Located in the left side of her chest. It did radiate to her left arm. The patient denies any shortness of breath or diaphoresis with this pain. She says that she has had chest pain before without the radiation and has seen cardiology and underwent a stress test that was not concerning. The patient has felt some associated palpitations today. Denies any fevers.    Records reviewed. Per medical record review patient has a history of palpitations.   Past Medical History:  Diagnosis Date  . Cholelithiasis   . Depression   . GERD (gastroesophageal reflux disease)   . Heart palpitations   . Muscle spasm   . PTSD (post-traumatic stress disorder)   . Thyroid disease    Hypothyroidism  . Vertigo     Patient Active Problem List   Diagnosis Date Noted  . Gallstones 11/02/2013    Past Surgical History:  Procedure Laterality Date  . BREAST BIOPSY Left 2006   neg- done in Dr. Festus Aloe office  . BREAST LUMPECTOMY  2006  . CESAREAN SECTION  00,2006  . TUBAL LIGATION  2006    Prior to Admission medications   Medication Sig Start Date End Date Taking? Authorizing Provider  acetaminophen (TYLENOL) 500 MG tablet Take 1,000 mg by mouth every 6 (six) hours as needed.    [provider]  azithromycin (ZITHROMAX Z-PAK) 250 MG tablet Take 2 tablets (500 mg) on  Day 1,  followed by 1 tablet (250 mg) once daily on Days 2 through 5. 07/02/17   Marylene Land, NP  benzonatate (TESSALON) 200 MG capsule Take 1 capsule (200 mg total) by mouth 3 (three) times daily as needed for cough. 07/02/17   Marylene Land, NP   busPIRone (BUSPAR) 7.5 MG tablet Take 7.5 mg by mouth 3 (three) times daily.    [provider]  butalbital-acetaminophen-caffeine (FIORICET WITH CODEINE) 50-325-40-30 MG capsule Take 1-2 capsules by mouth every 4 (four) hours as needed for headache.    [provider]  hydrochlorothiazide (MICROZIDE) 12.5 MG capsule Take 12.5 mg by mouth daily.    [provider]  hydrOXYzine (ATARAX/VISTARIL) 50 MG tablet Take 25-50 mg by mouth as needed.    [provider]  levothyroxine (SYNTHROID, LEVOTHROID) 125 MCG tablet Take 1 tablet by mouth daily.    [provider]  pantoprazole (PROTONIX) 40 MG tablet Take 40 mg by mouth daily.    [provider]  ranitidine (ZANTAC) 150 MG tablet Take 150 mg by mouth 2 (two) times daily.    [provider]  sucralfate (CARAFATE) 1 g tablet Take 1 tablet (1 g total) by mouth 2 (two) times daily for 14 days. 01/17/18 01/31/18  Merlyn Lot, MD  zolpidem (AMBIEN) 5 MG tablet Take 5 mg by mouth at bedtime as needed for sleep.    [provider]    Allergies Zofran Alvis Lemmings hcl], Codeine, Neomycin, and Vicodin [hydrocodone-acetaminophen]  Family History  Problem Relation Age of Onset  . Diabetes Father   . Heart disease Father   . Breast cancer Neg Hx     Social History Social History   Tobacco Use  .  Smoking status: Former Smoker    Packs/day: 0.50    Years: 18.00    Pack years: 9.00    Types: Cigarettes  . Smokeless tobacco: Never Used  Substance Use Topics  . Alcohol use: No  . Drug use: No    Review of Systems Constitutional: No fever/chills Eyes: No visual changes. ENT: No sore throat. Cardiovascular: Positive for chest pain and palpitations. Respiratory: Denies shortness of breath. Gastrointestinal: No abdominal pain.  No nausea, no vomiting.  No diarrhea.   Genitourinary: Negative for dysuria. Musculoskeletal: Negative for back pain. Skin: Negative for  rash. Neurological: Negative for headaches, focal weakness or numbness.  ____________________________________________   PHYSICAL EXAM:  VITAL SIGNS: ED Triage Vitals  Enc Vitals Group     BP 11/03/18 1933 (!) 147/86     Pulse Rate 11/03/18 1933 81     Resp 11/03/18 1933 18     Temp 11/03/18 1933 98.7 F (37.1 C)     Temp Source 11/03/18 1933 Oral     SpO2 11/03/18 1933 99 %     Weight 11/03/18 1935 240 lb (108.9 kg)     Height 11/03/18 1935 5\' 8"  (1.727 m)   Constitutional: Alert and oriented.  Eyes: Conjunctivae are normal.  ENT      Head: Normocephalic and atraumatic.      Nose: No congestion/rhinnorhea.      Mouth/Throat: Mucous membranes are moist.      Neck: No stridor. Hematological/Lymphatic/Immunilogical: No cervical lymphadenopathy. Cardiovascular: Normal rate, regular rhythm.  No murmurs, rubs, or gallops.  Respiratory: Normal respiratory effort without tachypnea nor retractions. Breath sounds are clear and equal bilaterally. No wheezes/rales/rhonchi. Gastrointestinal: Soft and non tender. No rebound. No guarding.  Genitourinary: Deferred Musculoskeletal: Normal range of motion in all extremities. No lower extremity edema. Neurologic:  Normal speech and language. No gross focal neurologic deficits are appreciated.  Skin:  Skin is warm, dry and intact. No rash noted. Psychiatric: Mood and affect are normal. Speech and behavior are normal. Patient exhibits appropriate insight and judgment.  ____________________________________________    LABS (pertinent positives/negatives)  Trop hs <2  -> 3 Upreg negative CBC wbc 7.8, gb 14.2, plt 269 BMP na 141, k 2.9, glu 103, cr 0.84  ____________________________________________   EKG  I, Phineas SemenGraydon Rick Carruthers, attending physician, personally viewed and interpreted this EKG  EKG Time: 2205 Rate: 77 Rhythm: normal sinus rhythm Axis: left axis deviation Intervals: qtc 452 QRS: narrow ST changes: no st  elevation Impression: abnormal ekg ____________________________________________    RADIOLOGY  CXR No acute process  ____________________________________________   PROCEDURES  Procedures  ____________________________________________   INITIAL IMPRESSION / ASSESSMENT AND PLAN / ED COURSE  Pertinent labs & imaging results that were available during my care of the patient were reviewed by me and considered in my medical decision making (see chart for details).   Patient presented to the emergency department today for chest pain.  Differential would be broad including ACS, pneumonia, pneumothorax, PE, dissection, esophagitis, costochondritis amongst other etiologies.  Patient had troponin negative x2.  Patient did feel better during her stay in the emergency department. At this time think ACS less likely. Additionally CXR without concerning findings. Discussed work up with patient. Do wonder if acid reflux is cause of pain, patient states she is under a lot of stress recently. She is already on antacid. Will add on sucralfate.   ____________________________________________   FINAL CLINICAL IMPRESSION(S) / ED DIAGNOSES  Final diagnoses:  Nonspecific chest pain  Gastritis, presence  of bleeding unspecified, unspecified chronicity, unspecified gastritis type  Hypokalemia     Note: This dictation was prepared with Dragon dictation. Any transcriptional errors that result from this process are unintentional     Phineas SemenGoodman, Miriah Maruyama, MD 11/04/18 [id number]

## 2018-11-12 ENCOUNTER — Ambulatory Visit: Payer: Self-pay

## 2018-11-19 ENCOUNTER — Other Ambulatory Visit: Payer: Self-pay

## 2018-11-19 ENCOUNTER — Ambulatory Visit: Payer: Self-pay

## 2018-11-25 ENCOUNTER — Ambulatory Visit: Payer: Self-pay

## 2018-12-02 ENCOUNTER — Ambulatory Visit: Payer: Self-pay | Attending: Oncology

## 2019-01-30 ENCOUNTER — Other Ambulatory Visit: Payer: Self-pay

## 2019-01-30 DIAGNOSIS — Z20822 Contact with and (suspected) exposure to covid-19: Secondary | ICD-10-CM

## 2019-01-31 ENCOUNTER — Telehealth: Payer: Self-pay

## 2019-01-31 LAB — NOVEL CORONAVIRUS, NAA: SARS-CoV-2, NAA: NOT DETECTED

## 2019-01-31 NOTE — Telephone Encounter (Signed)
Patient calling to ask if her covid results are back yet, tested on yesterday. I advised no results and to keep checking on her MyChart for results, she verbalized understanding. 

## 2019-02-25 ENCOUNTER — Other Ambulatory Visit: Payer: Self-pay

## 2019-02-25 ENCOUNTER — Emergency Department: Payer: Self-pay

## 2019-02-25 ENCOUNTER — Emergency Department
Admission: EM | Admit: 2019-02-25 | Discharge: 2019-02-25 | Disposition: A | Discharge status: Home / Self Care | Payer: Self-pay | Attending: Emergency Medicine | Admitting: Emergency Medicine

## 2019-02-25 ENCOUNTER — Encounter: Payer: Self-pay | Admitting: Emergency Medicine

## 2019-02-25 DIAGNOSIS — Z87891 Personal history of nicotine dependence: Secondary | ICD-10-CM | POA: Insufficient documentation

## 2019-02-25 DIAGNOSIS — E039 Hypothyroidism, unspecified: Secondary | ICD-10-CM | POA: Insufficient documentation

## 2019-02-25 DIAGNOSIS — Z3202 Encounter for pregnancy test, result negative: Secondary | ICD-10-CM | POA: Insufficient documentation

## 2019-02-25 DIAGNOSIS — Z79899 Other long term (current) drug therapy: Secondary | ICD-10-CM | POA: Insufficient documentation

## 2019-02-25 DIAGNOSIS — R0789 Other chest pain: Secondary | ICD-10-CM | POA: Insufficient documentation

## 2019-02-25 LAB — BASIC METABOLIC PANEL
Anion gap: 12 (ref 5–15)
BUN: 10 mg/dL (ref 6–20)
CO2: 26 mmol/L (ref 22–32)
Calcium: 9.6 mg/dL (ref 8.9–10.3)
Chloride: 102 mmol/L (ref 98–111)
Creatinine, Ser: 0.83 mg/dL (ref 0.44–1.00)
GFR calc Af Amer: >60 mL/min (ref >60)
GFR calc non Af Amer: >60 mL/min (ref >60)
Glucose, Bld: 120 mg/dL — ABNORMAL HIGH (ref 70–99)
Potassium: 3.4 mmol/L — ABNORMAL LOW (ref 3.5–5.1)
Sodium: 140 mmol/L (ref 135–145)

## 2019-02-25 LAB — TROPONIN I (HIGH SENSITIVITY)
Troponin I (High Sensitivity): 3 ng/L (ref <18)
Troponin I (High Sensitivity): <2 ng/L (ref <18)

## 2019-02-25 LAB — CBC
HCT: 42.5 % (ref 36.0–46.0)
Hemoglobin: 14.2 g/dL (ref 12.0–15.0)
MCH: 29.6 pg (ref 26.0–34.0)
MCHC: 33.4 g/dL (ref 30.0–36.0)
MCV: 88.7 fL (ref 80.0–100.0)
Platelets: 246 10*3/uL (ref 150–400)
RBC: 4.79 MIL/uL (ref 3.87–5.11)
RDW: 13.9 % (ref 11.5–15.5)
WBC: 5.1 10*3/uL (ref 4.0–10.5)
nRBC: 0 % (ref 0.0–0.2)

## 2019-02-25 LAB — POCT PREGNANCY, URINE: Preg Test, Ur: NEGATIVE

## 2019-02-25 MED ORDER — CYCLOBENZAPRINE HCL 10 MG PO TABS
10.0000 mg | ORAL_TABLET | Freq: Once | ORAL | Status: DC
  Filled 2019-02-25: qty 1

## 2019-02-25 MED ORDER — NAPROXEN 375 MG PO TABS: 375.0000 mg | ORAL_TABLET | Freq: Two times a day (BID) | ORAL | 0 refills | Status: AC

## 2019-02-25 MED ORDER — METHOCARBAMOL 500 MG PO TABS: 500.0000 mg | ORAL_TABLET | Freq: Three times a day (TID) | ORAL | 0 refills | Status: AC | PRN

## 2019-02-25 MED ORDER — KETOROLAC TROMETHAMINE 60 MG/2ML IM SOLN
60.0000 mg | Freq: Once | INTRAMUSCULAR | Status: AC
  Administered 2019-02-25: 60 mg via INTRAMUSCULAR
  Filled 2019-02-25: qty 2

## 2019-02-25 MED ORDER — SODIUM CHLORIDE 0.9% FLUSH: 3.0000 mL | Freq: Once | INTRAVENOUS | Status: DC

## 2019-02-25 NOTE — ED Notes (Addendum)
Pt c/o chest pain that started yesterday at 1700 that radiated to her back, states she took medications for acid to help because she ate spicy foods but is still having intermittent chest pain at this time that worsens with deep breathing. Pt in NAD at this time

## 2019-02-25 NOTE — ED Provider Notes (Signed)
[facility] Emergency Department Provider Note  ____________________________________________   First MD Initiated Contact with Patient 02/25/19 [phone]     (approximate)  I have reviewed the triage vital signs and the nursing notes.   HISTORY  Chief Complaint Chest Pain    HPI Gabriella Chambers is a 48 y.o. female with past medical history as below here with intermittent chest pain.  The patient states that for the last day, she has had persistent chest pain.  The pain began at approximately 5 PM yesterday.  It started as an aching, but also burning and sharp left upper chest pain.  The pain was worse with movement and palpation.  She had associated worsening with movement and exertion.  No alleviating factors.  No cough.  No hemoptysis.  The pain is been constant.  She states that she had difficulty sleeping because certain positions would make it hurt more.  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  No known fevers.  No leg swelling or history of DVT or PE.  She does not smoke.        Past Medical History:  Diagnosis Date  . Cholelithiasis   . Depression   . GERD (gastroesophageal reflux disease)   . Heart palpitations   . Muscle spasm   . PTSD (post-traumatic stress disorder)   . Thyroid disease    Hypothyroidism  . Vertigo     Patient Active Problem List   Diagnosis Date Noted  . Gallstones 11/02/2013    Past Surgical History:  Procedure Laterality Date  . BREAST BIOPSY Left 2006   neg- done in Dr. Purvis Sheffield office  . BREAST LUMPECTOMY  2006  . CESAREAN SECTION  00,2006  . TUBAL LIGATION  2006    Prior to Admission medications   Medication Sig Start Date End Date Taking? Authorizing Provider  acetaminophen (TYLENOL) 500 MG tablet Take 1,000 mg by mouth every 6 (six) hours as needed.    [provider]  azithromycin (ZITHROMAX Z-PAK)  250 MG tablet Take 2 tablets (500 mg) on  Day 1,  followed by 1 tablet (250 mg) once daily on Days 2 through 5. 07/02/17   Renford Dills, NP  benzonatate (TESSALON) 200 MG capsule Take 1 capsule (200 mg total) by mouth 3 (three) times daily as needed for cough. 07/02/17   Renford Dills, NP  busPIRone (BUSPAR) 7.5 MG tablet Take 7.5 mg by mouth 3 (three) times daily.    [provider]  butalbital-acetaminophen-caffeine (FIORICET WITH CODEINE) 50-325-40-30 MG capsule Take 1-2 capsules by mouth every 4 (four) hours as needed for headache.    [provider]  hydrochlorothiazide (MICROZIDE) 12.5 MG capsule Take 12.5 mg by mouth daily.    [provider]  hydrOXYzine (ATARAX/VISTARIL) 50 MG tablet Take 25-50 mg by mouth as needed.    [provider]  levothyroxine (SYNTHROID, LEVOTHROID) 125 MCG tablet Take 1 tablet by mouth daily.    [provider]  methocarbamol (ROBAXIN) 500 MG tablet Take 1 tablet (500 mg total) by mouth every 8 (eight) hours as needed for up to 7 days for muscle spasms. 02/25/19 03/04/19  Shaune Pollack, MD  naproxen (NAPROSYN) 375 MG tablet Take 1 tablet (375 mg total) by mouth 2 (two) times daily with a meal for 7 days. 02/25/19 03/04/19  Shaune Pollack, MD  pantoprazole (PROTONIX) 40 MG tablet Take 40  mg by mouth daily.    [provider]  ranitidine (ZANTAC) 150 MG tablet Take 150 mg by mouth 2 (two) times daily.    [provider]  sucralfate (CARAFATE) 1 g tablet Take 1 tablet (1 g total) by mouth 2 (two) times daily for 14 days. 01/17/18 01/31/18  Merlyn Lot, MD  sucralfate (CARAFATE) 1 g tablet Take 1 tablet (1 g total) by mouth 4 (four) times daily. 11/04/18   Nance Pear, MD  zolpidem (AMBIEN) 5 MG tablet Take 5 mg by mouth at bedtime as needed for sleep.    [provider]    Allergies Zofran Alvis Lemmings hcl], Codeine, Neomycin, and Vicodin [hydrocodone-acetaminophen]  Family History   Problem Relation Age of Onset  . Diabetes Father   . Heart disease Father   . Breast cancer Neg Hx     Social History Social History   Tobacco Use  . Smoking status: Former Smoker    Packs/day: 0.50    Years: 18.00    Pack years: 9.00    Types: Cigarettes  . Smokeless tobacco: Never Used  Substance Use Topics  . Alcohol use: No  . Drug use: No    Review of Systems  Review of Systems  Constitutional: Negative for fatigue and fever.  HENT: Negative for congestion and sore throat.   Eyes: Negative for visual disturbance.  Respiratory: Negative for cough and shortness of breath.   Cardiovascular: Positive for chest pain.  Gastrointestinal: Negative for abdominal pain, diarrhea, nausea and vomiting.  Genitourinary: Negative for flank pain.  Musculoskeletal: Negative for back pain and neck pain.  Skin: Negative for rash and wound.  Neurological: Negative for weakness.  All other systems reviewed and are negative.    ____________________________________________  PHYSICAL EXAM:      VITAL SIGNS: ED Triage Vitals  Enc Vitals Group     BP 02/25/19 0839 (!) 146/85     Pulse Rate 02/25/19 0839 88     Resp 02/25/19 0839 16     Temp 02/25/19 0839 98.5 F (36.9 C)     Temp Source 02/25/19 0839 Oral     SpO2 02/25/19 0839 100 %     Weight 02/25/19 0843 240 lb (108.9 kg)     Height 02/25/19 0843 5\' 6"  (1.676 m)     Head Circumference --      Peak Flow --      Pain Score 02/25/19 0843 3     Pain Loc --      Pain Edu? --      Excl. in [place]? --      Physical Exam Vitals signs and nursing note reviewed.  Constitutional:      General: She is not in acute distress.    Appearance: She is well-developed.  HENT:     Head: Normocephalic and atraumatic.  Eyes:     Conjunctiva/sclera: Conjunctivae normal.  Neck:     Musculoskeletal: Neck supple.  Cardiovascular:     Rate and Rhythm: Normal rate and regular rhythm.     Heart sounds: Normal heart sounds. No murmur. No  friction rub.  Pulmonary:     Effort: Pulmonary effort is normal. No respiratory distress.     Breath sounds: Normal breath sounds. No decreased breath sounds, wheezing or rales.     Comments: Mild tenderness to palpation of the left anterior chest wall, worse in the intercostal parasternal spaces.  No bruising or deformity.  No rash.  No lesions. Abdominal:  General: There is no distension.     Palpations: Abdomen is soft.     Tenderness: There is no abdominal tenderness.  Musculoskeletal:     Comments: No leg swelling or calf tenderness.  Skin:    General: Skin is warm.     Capillary Refill: Capillary refill takes less than 2 seconds.  Neurological:     Mental Status: She is alert and oriented to person, place, and time.     Motor: No abnormal muscle tone.       ____________________________________________   LABS (all labs ordered are listed, but only abnormal results are displayed)  Labs Reviewed  BASIC METABOLIC PANEL - Abnormal; Notable for the following components:      Result Value   Potassium 3.4 (*)    Glucose, Bld 120 (*)    All other components within normal limits  CBC  POCT PREGNANCY, URINE  POC URINE PREG, ED  TROPONIN I (HIGH SENSITIVITY)  TROPONIN I (HIGH SENSITIVITY)    ____________________________________________  EKG: Normal sinus rhythm, ventricular rate 87, PR 134, QRS 74, QTc 435.  Inverted T waves noted in anterior leads, nonspecific. ________________________________________  RADIOLOGY All imaging, including plain films, CT scans, and ultrasounds, independently reviewed by me, and interpretations confirmed via formal radiology reads.  ED MD interpretation:   CXR: Clear  Official radiology report(s): Dg Chest 2 View  Result Date: 02/25/2019 CLINICAL DATA:  Chest pain EXAM: CHEST - 2 VIEW COMPARISON:  11/04/2018 FINDINGS: Normal heart size and mediastinal contours. No acute infiltrate or edema. No effusion or pneumothorax. No acute osseous  findings. IMPRESSION: Negative chest. Electronically Signed   By: Marnee SpringJonathon  Watts M.D.   On: 02/25/2019 09:18    ____________________________________________  PROCEDURES   Procedure(s) performed (including Critical Care):  Procedures  ____________________________________________  INITIAL IMPRESSION / MDM / ASSESSMENT AND PLAN / ED COURSE  As part of my medical decision making, I reviewed the following data within the electronic MEDICAL RECORD NUMBER Notes from prior ED visits and [place] Controlled Substance Database      *Valetta MoleSharmee L Bradeen was evaluated in Emergency Department on 02/25/2019 for the symptoms described in the history of present illness. She was evaluated in the context of the global COVID-19 pandemic, which necessitated consideration that the patient might be at risk for infection with the SARS-CoV-2 virus that causes COVID-19. Institutional protocols and algorithms that pertain to the evaluation of patients at risk for COVID-19 are in a state of rapid change based on information released by regulatory bodies including the CDC and federal and state organizations. These policies and algorithms were followed during the patient's care in the ED.  Some ED evaluations and interventions may be delayed as a result of limited staffing during the pandemic.*      Medical Decision Making:  48 yo F here with atypical, reproducible chest pain. Suspect intercostal sprain vs costochondritis. CXR is clear, no PNA, PTX. EKG does show TWI in diffuse leads but this has been noted preivously, and troponin negative x 2 - doubt ACS. She can f/u with Cards as outpt. Pain is not c/w PE and she is PERC neg. D/c home.  ____________________________________________  FINAL CLINICAL IMPRESSION(S) / ED DIAGNOSES  Final diagnoses:  Atypical chest pain     MEDICATIONS GIVEN DURING THIS VISIT:  Medications  sodium chloride flush (NS) 0.9 % injection 3 mL (has no administration in time range)   cyclobenzaprine (FLEXERIL) tablet 10 mg (10 mg Oral Refused 02/25/19 1202)  ketorolac (TORADOL) injection  60 mg (60 mg Intramuscular Given 02/25/19 1155)     ED Discharge Orders         Ordered    methocarbamol (ROBAXIN) 500 MG tablet  Every 8 hours PRN     02/25/19 1224    naproxen (NAPROSYN) 375 MG tablet  2 times daily with meals     02/25/19 1224           Note:  This document was prepared using Dragon voice recognition software and may include unintentional dictation errors.   Shaune Pollack, MD 02/25/19 1227

## 2019-02-25 NOTE — ED Notes (Signed)
Pt refused Flexeril, states it gives her severe headaches. MD aware

## 2019-02-25 NOTE — ED Triage Notes (Signed)
chst pain since 5pm yesterday.

## 2019-02-25 NOTE — ED Notes (Signed)
Pt alert and oriented X4. NAD noted 

## 2019-03-03 ENCOUNTER — Other Ambulatory Visit: Payer: Self-pay

## 2019-03-03 DIAGNOSIS — Z20822 Contact with and (suspected) exposure to covid-19: Secondary | ICD-10-CM

## 2019-03-04 ENCOUNTER — Telehealth: Payer: Self-pay | Admitting: General Practice

## 2019-03-04 LAB — NOVEL CORONAVIRUS, NAA: SARS-CoV-2, NAA: NOT DETECTED

## 2019-03-04 NOTE — Telephone Encounter (Signed)
Gave patient negative covid test results results Patient understood

## 2019-04-16 ENCOUNTER — Other Ambulatory Visit: Payer: Self-pay

## 2019-04-16 ENCOUNTER — Encounter: Payer: Self-pay | Admitting: Intensive Care

## 2019-04-16 ENCOUNTER — Emergency Department
Admission: EM | Admit: 2019-04-16 | Discharge: 2019-04-16 | Disposition: A | Discharge status: Home / Self Care | Payer: Self-pay | Attending: Emergency Medicine | Admitting: Emergency Medicine

## 2019-04-16 ENCOUNTER — Emergency Department: Payer: Self-pay

## 2019-04-16 DIAGNOSIS — Z20828 Contact with and (suspected) exposure to other viral communicable diseases: Secondary | ICD-10-CM | POA: Insufficient documentation

## 2019-04-16 DIAGNOSIS — R0789 Other chest pain: Secondary | ICD-10-CM | POA: Insufficient documentation

## 2019-04-16 DIAGNOSIS — Z87891 Personal history of nicotine dependence: Secondary | ICD-10-CM | POA: Insufficient documentation

## 2019-04-16 DIAGNOSIS — I10 Essential (primary) hypertension: Secondary | ICD-10-CM | POA: Insufficient documentation

## 2019-04-16 DIAGNOSIS — R079 Chest pain, unspecified: Secondary | ICD-10-CM

## 2019-04-16 DIAGNOSIS — F419 Anxiety disorder, unspecified: Secondary | ICD-10-CM | POA: Insufficient documentation

## 2019-04-16 DIAGNOSIS — E039 Hypothyroidism, unspecified: Secondary | ICD-10-CM | POA: Insufficient documentation

## 2019-04-16 HISTORY — DX: Essential (primary) hypertension: I10

## 2019-04-16 LAB — CBC
HCT: 42.6 % (ref 36.0–46.0)
Hemoglobin: 14.9 g/dL (ref 12.0–15.0)
MCH: 28.9 pg (ref 26.0–34.0)
MCHC: 35 g/dL (ref 30.0–36.0)
MCV: 82.7 fL (ref 80.0–100.0)
Platelets: 278 10*3/uL (ref 150–400)
RBC: 5.15 MIL/uL — ABNORMAL HIGH (ref 3.87–5.11)
RDW: 13.4 % (ref 11.5–15.5)
WBC: 8.7 10*3/uL (ref 4.0–10.5)
nRBC: 0 % (ref 0.0–0.2)

## 2019-04-16 LAB — BASIC METABOLIC PANEL
Anion gap: 12 (ref 5–15)
BUN: 11 mg/dL (ref 6–20)
CO2: 21 mmol/L — ABNORMAL LOW (ref 22–32)
Calcium: 9.3 mg/dL (ref 8.9–10.3)
Chloride: 104 mmol/L (ref 98–111)
Creatinine, Ser: 0.98 mg/dL (ref 0.44–1.00)
GFR calc Af Amer: >60 mL/min (ref >60)
GFR calc non Af Amer: >60 mL/min (ref >60)
Glucose, Bld: 114 mg/dL — ABNORMAL HIGH (ref 70–99)
Potassium: 3.5 mmol/L (ref 3.5–5.1)
Sodium: 137 mmol/L (ref 135–145)

## 2019-04-16 LAB — TROPONIN I (HIGH SENSITIVITY)
Troponin I (High Sensitivity): <2 ng/L (ref <18)
Troponin I (High Sensitivity): <2 ng/L (ref <18)

## 2019-04-16 LAB — SARS CORONAVIRUS 2 (TAT 6-24 HRS): SARS Coronavirus 2: NEGATIVE

## 2019-04-16 MED ORDER — DIAZEPAM 5 MG PO TABS
10.0000 mg | ORAL_TABLET | Freq: Once | ORAL | Status: AC
  Administered 2019-04-16: 10 mg via ORAL
  Filled 2019-04-16: qty 2

## 2019-04-16 NOTE — ED Triage Notes (Signed)
Patient c/o central chest heaviness/tightness that started radiating to back and left jaw and left arm. A&O x4 in triage. Unlabored breathing

## 2019-04-16 NOTE — ED Notes (Signed)
Blood collected by paramedic student 

## 2019-04-16 NOTE — ED Provider Notes (Signed)
[facility] Emergency Department Provider Note       Time seen: ----------------------------------------- 12:53 PM on 04/16/2019 -----------------------------------------   I have reviewed the triage vital signs and the nursing notes.  HISTORY   Chief Complaint Chest Pain    HPI Gabriella Chambers is a 48 y.o. female with a history of cholelithiasis, depression, GERD, hypertension, PTSD, thyroid disease who presents to the ED for central chest heaviness and tightness that radiates into her back, left arm and left jaw.  Discomfort currently is 1 out of 10.  She describes significant anxiety currently.  She describes a strong family history of heart disease.  Past Medical History:  Diagnosis Date  . Cholelithiasis   . Depression   . GERD (gastroesophageal reflux disease)   . Heart palpitations   . Hypertension   . Muscle spasm   . PTSD (post-traumatic stress disorder)   . Thyroid disease    Hypothyroidism  . Vertigo     Patient Active Problem List   Diagnosis Date Noted  . Gallstones 11/02/2013    Past Surgical History:  Procedure Laterality Date  . BREAST BIOPSY Left 2006   neg- done in Dr. Purvis Sheffield office  . BREAST LUMPECTOMY  2006  . CESAREAN SECTION  00,2006  . TUBAL LIGATION  2006    Allergies Zofran [ondansetron hcl], Codeine, Neomycin, and Vicodin [hydrocodone-acetaminophen]  Social History Social History   Tobacco Use  . Smoking status: Former Smoker    Packs/day: 0.50    Years: 18.00    Pack years: 9.00    Types: Cigarettes  . Smokeless tobacco: Never Used  Substance Use Topics  . Alcohol use: No  . Drug use: No    Review of Systems Constitutional: Negative for fever. Cardiovascular: Positive for chest pain Respiratory: Negative for shortness of breath. Gastrointestinal: Negative for abdominal pain, vomiting and diarrhea. Musculoskeletal: Negative for back pain. Skin: Negative for rash. Neurological: Negative for  headaches, focal weakness or numbness.  All systems negative/normal/unremarkable except as stated in the HPI  ____________________________________________   PHYSICAL EXAM:  VITAL SIGNS: ED Triage Vitals  Enc Vitals Group     BP 04/16/19 1155 (!) 153/86     Pulse Rate 04/16/19 1155 95     Resp 04/16/19 1155 18     Temp 04/16/19 1155 99.6 F (37.6 C)     Temp Source 04/16/19 1155 Oral     SpO2 04/16/19 1155 99 %     Weight 04/16/19 1149 240 lb (108.9 kg)     Height 04/16/19 1149 5\' 4"  (1.626 m)     Head Circumference --      Peak Flow --      Pain Score 04/16/19 1148 1     Pain Loc --      Pain Edu? --      Excl. in [place]? --     Constitutional: Alert and oriented.  Mildly anxious, no distress Eyes: Conjunctivae are normal. Normal extraocular movements. ENT      Head: Normocephalic and atraumatic.      Nose: No congestion/rhinnorhea.      Mouth/Throat: Mucous membranes are moist.      Neck: No stridor. Cardiovascular: Normal rate, regular rhythm. No murmurs, rubs, or gallops. Respiratory: Normal respiratory effort without tachypnea nor retractions. Breath sounds are clear and equal bilaterally. No wheezes/rales/rhonchi. Gastrointestinal: Soft and nontender. Normal bowel sounds Musculoskeletal: Nontender with normal range of motion in extremities. No lower extremity tenderness nor edema. Neurologic:  Normal speech  and language. No gross focal neurologic deficits are appreciated.  Skin:  Skin is warm, dry and intact. No rash noted. Psychiatric: Mood and affect are normal. Speech and behavior are normal.  ____________________________________________  EKG: Interpreted by me.  Sinus rhythm with rate of 92 bpm, normal PR interval, leftward axis, normal QT  ____________________________________________  ED COURSE:  As part of my medical decision making, I reviewed the following data within the [facility] History obtained from family if available, nursing notes,  old chart and ekg, as well as notes from prior ED visits. Patient presented for chest pain, we will assess with labs and imaging as indicated at this time.   Procedures  Gabriella Chambers was evaluated in Emergency Department on 04/16/2019 for the symptoms described in the history of present illness. She was evaluated in the context of the global COVID-19 pandemic, which necessitated consideration that the patient might be at risk for infection with the SARS-CoV-2 virus that causes COVID-19. Institutional protocols and algorithms that pertain to the evaluation of patients at risk for COVID-19 are in a state of rapid change based on information released by regulatory bodies including the CDC and federal and state organizations. These policies and algorithms were followed during the patient's care in the ED.  ____________________________________________   LABS (pertinent positives/negatives)  Labs Reviewed  BASIC METABOLIC PANEL - Abnormal; Notable for the following components:      Result Value   CO2 21 (*)    Glucose, Bld 114 (*)    All other components within normal limits  CBC - Abnormal; Notable for the following components:   RBC 5.15 (*)    All other components within normal limits  SARS CORONAVIRUS 2 (TAT 6-24 HRS)  TROPONIN I (HIGH SENSITIVITY)    RADIOLOGY Images were viewed by me Chest xray IMPRESSION: No acute cardiopulmonary disease. ____________________________________________   DIFFERENTIAL DIAGNOSIS   Musculoskeletal pain, GERD, anxiety, MI, PE  FINAL ASSESSMENT AND PLAN  Chest pain   Plan: The patient had presented for chest pain. Patient's labs are unremarkable. Patient's imaging did not reveal any acute process.  Patient was given oral Valium which seemed to resolve her symptoms.  Should be referred to cardiology for outpatient follow-up.   Laurence Aly, MD    Note: This note was generated in part or whole with voice recognition software. Voice  recognition is usually quite accurate but there are transcription errors that can and very often do occur. I apologize for any typographical errors that were not detected and corrected.     Earleen Newport, MD 04/16/19 [phone]

## 2019-04-16 NOTE — ED Notes (Signed)
Observed pt in 5 hallway to be tearful and upset, provided pt with tissues and introduced myself prior to assessing.

## 2019-04-16 NOTE — ED Notes (Signed)
Unable to obtain e-signature. Verbal consent for discharge obtained from patient.

## 2019-04-17 ENCOUNTER — Telehealth: Payer: Self-pay

## 2019-04-17 NOTE — Telephone Encounter (Signed)
Pt notified of negative COVID-19 results. Understanding verbalized.  Gabriella Chambers   

## 2019-08-08 ENCOUNTER — Ambulatory Visit: Payer: Self-pay | Attending: Internal Medicine

## 2019-08-08 DIAGNOSIS — Z23 Encounter for immunization: Secondary | ICD-10-CM

## 2019-08-08 NOTE — Progress Notes (Signed)
   Covid-19 Vaccination Clinic  Name:  CELISA SCHOENBERG    MRN: [id number] DOB: 1970-05-31  08/08/2019  Ms. Macioce was observed post Covid-19 immunization for 15 minutes without incident. She was provided with Vaccine Information Sheet and instruction to access the V-Safe system.   Ms. Amenta was instructed to call 911 with any severe reactions post vaccine: Marland Kitchen Difficulty breathing  . Swelling of face and throat  . A fast heartbeat  . A bad rash all over body  . Dizziness and weakness   Immunizations Administered    Name Date Dose VIS Date Route   Pfizer COVID-19 Vaccine 08/08/2019 12:55 PM 0.3 mL 04/10/2019 Intramuscular   Manufacturer: [employer], [employer]   Lot: [id number]   NDC: [address]-4

## 2019-08-11 ENCOUNTER — Telehealth: Payer: Self-pay | Admitting: *Deleted

## 2019-08-11 ENCOUNTER — Ambulatory Visit: Payer: Self-pay | Admitting: *Deleted

## 2019-08-11 NOTE — Telephone Encounter (Signed)
Called and spoke with patient regarding her symptoms after her COVID vaccine on 08/08/2019 and she stated that her symptoms were based of her anxiety and she feels great and had no further issues. Advised to patient to call back with any questions or concerns. Patient verbalized understanding.

## 2019-09-02 ENCOUNTER — Ambulatory Visit: Payer: Self-pay

## 2020-06-27 ENCOUNTER — Other Ambulatory Visit: Payer: Self-pay

## 2020-06-27 ENCOUNTER — Emergency Department
Admission: EM | Admit: 2020-06-27 | Discharge: 2020-06-27 | Disposition: A | Discharge status: Home / Self Care | Payer: Self-pay | Attending: Emergency Medicine | Admitting: Emergency Medicine

## 2020-06-27 DIAGNOSIS — Z79899 Other long term (current) drug therapy: Secondary | ICD-10-CM | POA: Insufficient documentation

## 2020-06-27 DIAGNOSIS — Z87891 Personal history of nicotine dependence: Secondary | ICD-10-CM | POA: Insufficient documentation

## 2020-06-27 DIAGNOSIS — R42 Dizziness and giddiness: Secondary | ICD-10-CM | POA: Insufficient documentation

## 2020-06-27 DIAGNOSIS — E039 Hypothyroidism, unspecified: Secondary | ICD-10-CM | POA: Insufficient documentation

## 2020-06-27 DIAGNOSIS — I10 Essential (primary) hypertension: Secondary | ICD-10-CM | POA: Insufficient documentation

## 2020-06-27 MED ORDER — PROMETHAZINE HCL 25 MG/ML IJ SOLN
12.5000 mg | Freq: Once | INTRAMUSCULAR | Status: AC
  Administered 2020-06-27: 12.5 mg via INTRAVENOUS
  Filled 2020-06-27: qty 1

## 2020-06-27 MED ORDER — SODIUM CHLORIDE 0.9 % IV SOLN
1000.0000 mL | Freq: Once | INTRAVENOUS | Status: AC
  Administered 2020-06-27: 1000 mL via INTRAVENOUS

## 2020-06-27 NOTE — ED Triage Notes (Signed)
Pt via POV from home. Pt has a hx of vertigo. Pt states that she is having a vertigo attack. Pt c/o dizziness and nausea that started around 2:45pm. Pt has taken Valium, Antivert, and phenergan without relief. Pt is A&Ox4 and NAD.

## 2020-06-27 NOTE — ED Notes (Signed)
Iv attempted x1

## 2020-06-27 NOTE — ED Provider Notes (Signed)
[facility] Emergency Department Provider Note   ____________________________________________    I have reviewed the triage vital signs and the nursing notes.   HISTORY  Chief Complaint Dizziness     HPI Gabriella Chambers is a 50 y.o. female who presents with complaints of vertigo.  Patient reports a history of vertigo and reports symptoms started today at 2 PM, similar to prior episodes.  She took some Phenergan and Antivert with little improvement.  She reports typically her symptoms resolved within 4 hours however after today symptoms of not resolved and 4 hours she decided to come to the emergency department.  She denies headache or neuro deficits.  No fevers chills nausea vomiting.  No tinnitus at this time.  Reports her symptoms are somewhat better after being in the waiting room  Past Medical History:  Diagnosis Date  . Cholelithiasis   . Depression   . GERD (gastroesophageal reflux disease)   . Heart palpitations   . Hypertension   . Muscle spasm   . PTSD (post-traumatic stress disorder)   . Thyroid disease    Hypothyroidism  . Vertigo     Patient Active Problem List   Diagnosis Date Noted  . Gallstones 11/02/2013    Past Surgical History:  Procedure Laterality Date  . BREAST BIOPSY Left 2006   neg- done in Dr. Purvis Sheffield office  . BREAST LUMPECTOMY  2006  . CESAREAN SECTION  00,2006  . TUBAL LIGATION  2006    Prior to Admission medications   Medication Sig Start Date End Date Taking? Authorizing Provider  acetaminophen (TYLENOL) 500 MG tablet Take 1,000 mg by mouth every 6 (six) hours as needed.    [provider]  azithromycin (ZITHROMAX Z-PAK) 250 MG tablet Take 2 tablets (500 mg) on  Day 1,  followed by 1 tablet (250 mg) once daily on Days 2 through 5. 07/02/17   Renford Dills, NP  benzonatate (TESSALON) 200 MG capsule Take 1 capsule (200 mg total) by mouth 3 (three) times daily as needed for cough. 07/02/17   Renford Dills, NP  busPIRone (BUSPAR) 7.5 MG tablet Take 7.5 mg by mouth 3 (three) times daily.    [provider]  butalbital-acetaminophen-caffeine (FIORICET WITH CODEINE) 50-325-40-30 MG capsule Take 1-2 capsules by mouth every 4 (four) hours as needed for headache.    [provider]  hydrochlorothiazide (MICROZIDE) 12.5 MG capsule Take 12.5 mg by mouth daily.    [provider]  hydrOXYzine (ATARAX/VISTARIL) 50 MG tablet Take 25-50 mg by mouth as needed.    [provider]  levothyroxine (SYNTHROID, LEVOTHROID) 125 MCG tablet Take 1 tablet by mouth daily.    [provider]  pantoprazole (PROTONIX) 40 MG tablet Take 40 mg by mouth daily.    [provider]  ranitidine (ZANTAC) 150 MG tablet Take 150 mg by mouth 2 (two) times daily.    [provider]  sucralfate (CARAFATE) 1 g tablet Take 1 tablet (1 g total) by mouth 2 (two) times daily for 14 days. 01/17/18 01/31/18  Willy Eddy, MD  sucralfate (CARAFATE) 1 g tablet Take 1 tablet (1 g total) by mouth 4 (four) times daily. 11/04/18   Phineas Semen, MD  zolpidem (AMBIEN) 5 MG tablet Take 5 mg by mouth at bedtime as needed for sleep.    [provider]     Allergies Zofran Frazier Richards hcl], Codeine, Neomycin, and Vicodin [hydrocodone-acetaminophen]  Family History  Problem Relation Age of Onset  .  Diabetes Father   . Heart disease Father   . Breast cancer Neg Hx     Social History Social History   Tobacco Use  . Smoking status: Former Smoker    Packs/day: 0.50    Years: 18.00    Pack years: 9.00    Types: Cigarettes  . Smokeless tobacco: Never Used  Vaping Use  . Vaping Use: Never used  Substance Use Topics  . Alcohol use: No  . Drug use: No    Review of Systems  Constitutional: No fever/chills Eyes: No visual changes.  ENT: As above Cardiovascular: Denies chest pain. Respiratory: Denies shortness of breath. Gastrointestinal: No abdominal pain.   No nausea, no vomiting.   Genitourinary: Negative for dysuria. Musculoskeletal: Negative for back pain. Skin: Negative for rash. Neurological: Negative for headaches or weakness   ____________________________________________   PHYSICAL EXAM:  VITAL SIGNS: ED Triage Vitals  Enc Vitals Group     BP 06/27/20 1734 139/75     Pulse Rate 06/27/20 1734 71     Resp 06/27/20 1734 20     Temp 06/27/20 1734 98.4 F (36.9 C)     Temp Source 06/27/20 1734 Oral     SpO2 06/27/20 1734 100 %     Weight 06/27/20 1735 104.3 kg (230 lb)     Height 06/27/20 1735 1.676 m (5\' 6" )     Head Circumference --      Peak Flow --      Pain Score 06/27/20 1735 0     Pain Loc --      Pain Edu? --      Excl. in [place]? --     Constitutional: Alert and oriented. No acute distress.   Nose: No congestion/rhinnorhea. Mouth/Throat: Mucous membranes are moist.   Neck:  Painless ROM Cardiovascular: Normal rate, regular rhythm. Good peripheral circulation. Respiratory: Normal respiratory effort.  No retractions. Gastrointestinal: Soft and nontender. No distention.    Musculoskeletal:  Warm and well perfused Neurologic:  Normal speech and language. No gross focal neurologic deficits are appreciated.  Skin:  Skin is warm, dry and intact. No rash noted. Psychiatric: Mood and affect are normal. Speech and behavior are normal.  ____________________________________________   LABS (all labs ordered are listed, but only abnormal results are displayed)  Labs Reviewed - No data to display ____________________________________________  EKG  None ____________________________________________  RADIOLOGY  None ____________________________________________   PROCEDURES  Procedure(s) performed: No  Procedures   Critical Care performed: No ____________________________________________   INITIAL IMPRESSION / ASSESSMENT AND PLAN / ED COURSE  Pertinent labs & imaging results that were available during my  care of the patient were reviewed by me and considered in my medical decision making (see chart for details).  Patient with a history of vertigo presents with vertiginous symptoms.  No neuro deficits, no headache.  Symptoms appear consistent with prior episode.  She is feeling improved even prior to admission, will give IV fluids and additional 12.5 mg of IV Phenergan and reevaluate.  ----------------------------------------- 7:53 PM on 06/27/2020 -----------------------------------------  Patient feeling significantly improved, appropriate discharge at this time, recommend outpatient follow-up with ENT as needed,    ____________________________________________   FINAL CLINICAL IMPRESSION(S) / ED DIAGNOSES  Final diagnoses:  Vertigo        Note:  This document was prepared using Dragon voice recognition software and may include unintentional dictation errors.   06/29/2020, MD 06/27/20 [id number]

## 2021-02-26 IMAGING — MG DIGITAL DIAGNOSTIC BILATERAL MAMMOGRAM WITH TOMO AND CAD
6 of 10 series · 6 of 30 positions shown · non-contrast
Comparison: Previous exam(s).

CLINICAL DATA: Palpable left breast mass felt by the patient for 2
weeks.

EXAM:
DIGITAL DIAGNOSTIC LEFT MAMMOGRAM WITH CAD AND TOMO
ULTRASOUND LEFT BREAST

[L CC synth-2D]
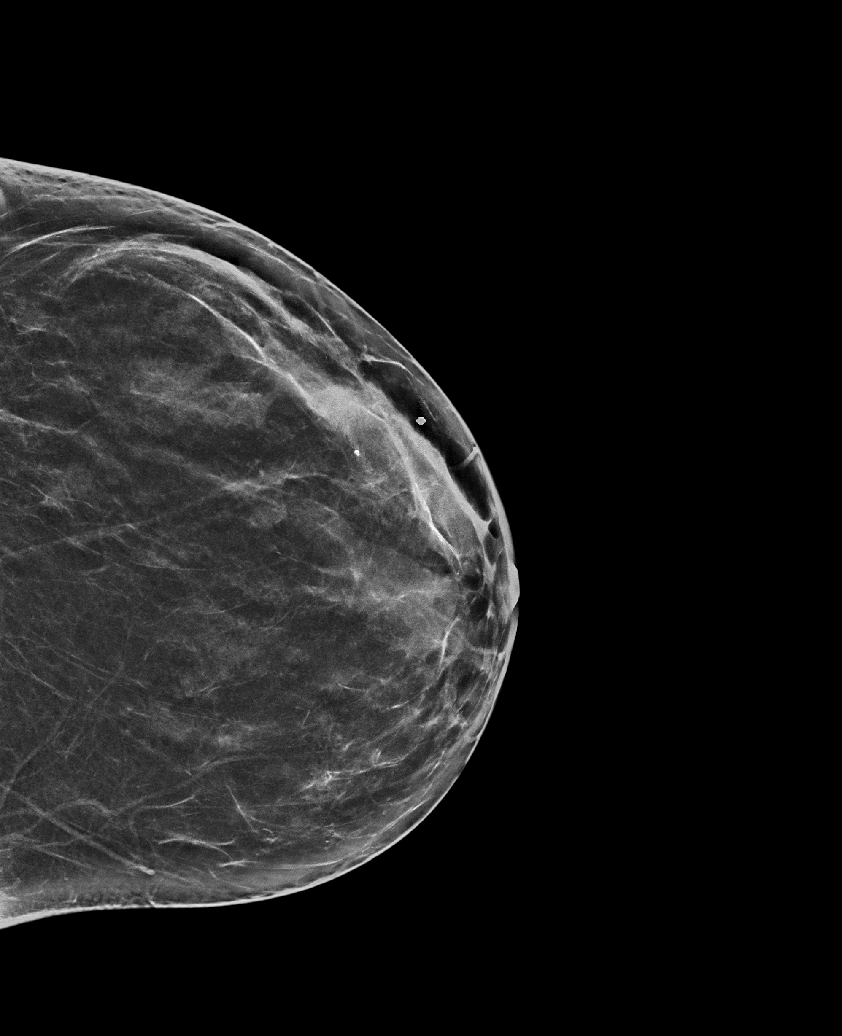

[R MLO synth-2D]
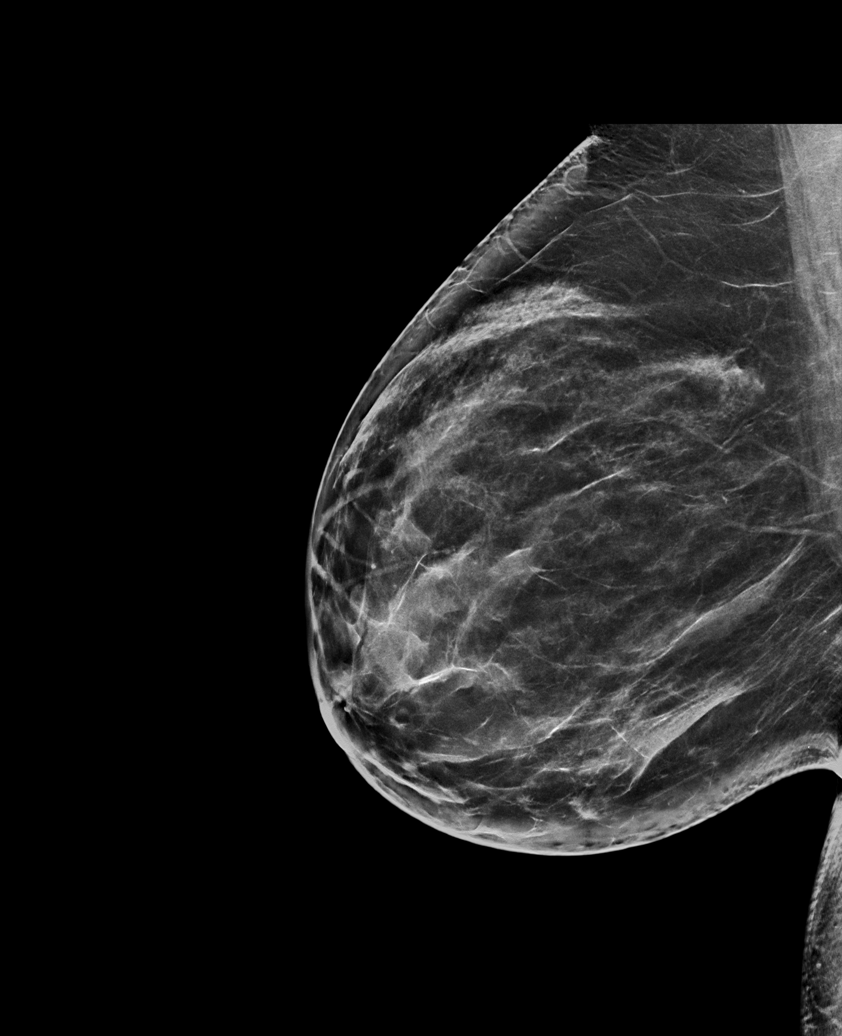

[R CC synth-2D]
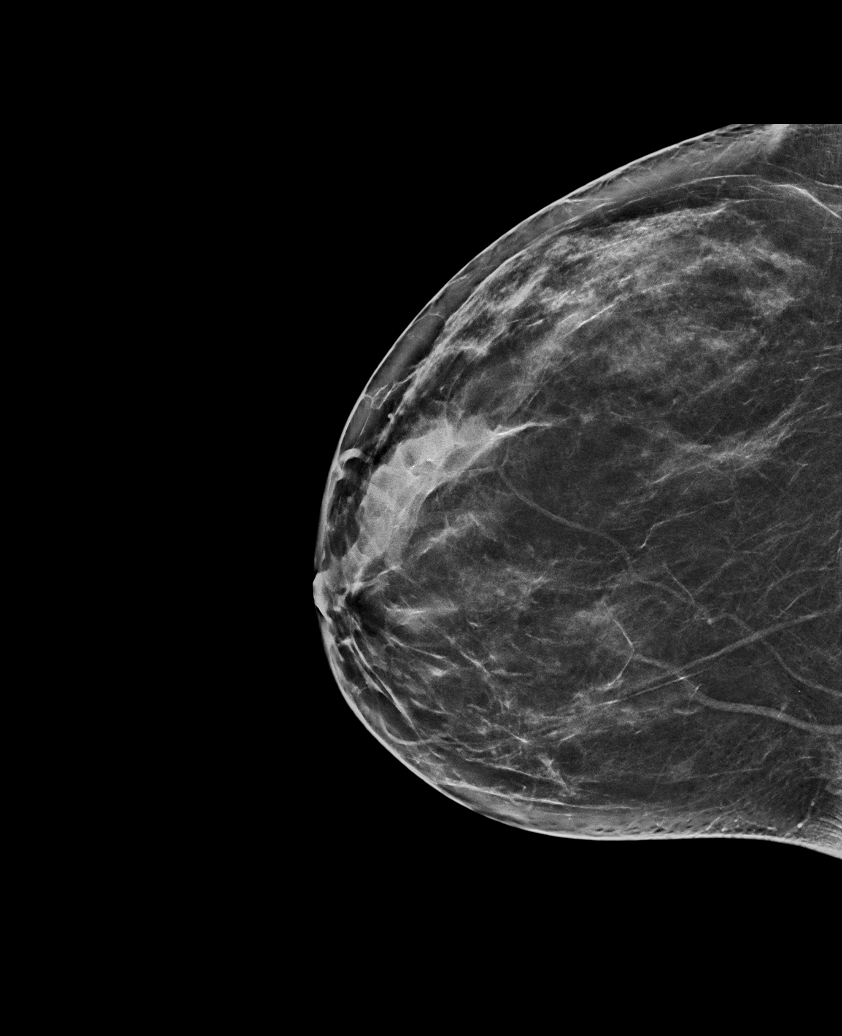

[L MLO synth-2D]
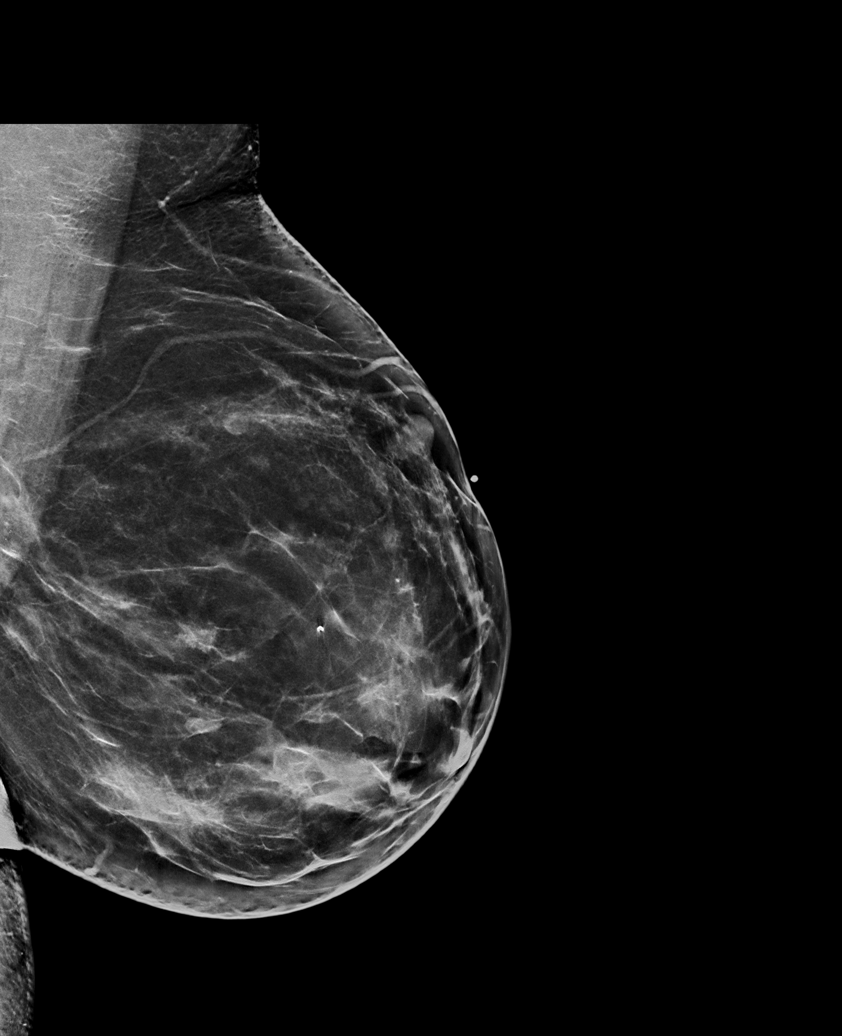

[L TAN synth-2D]
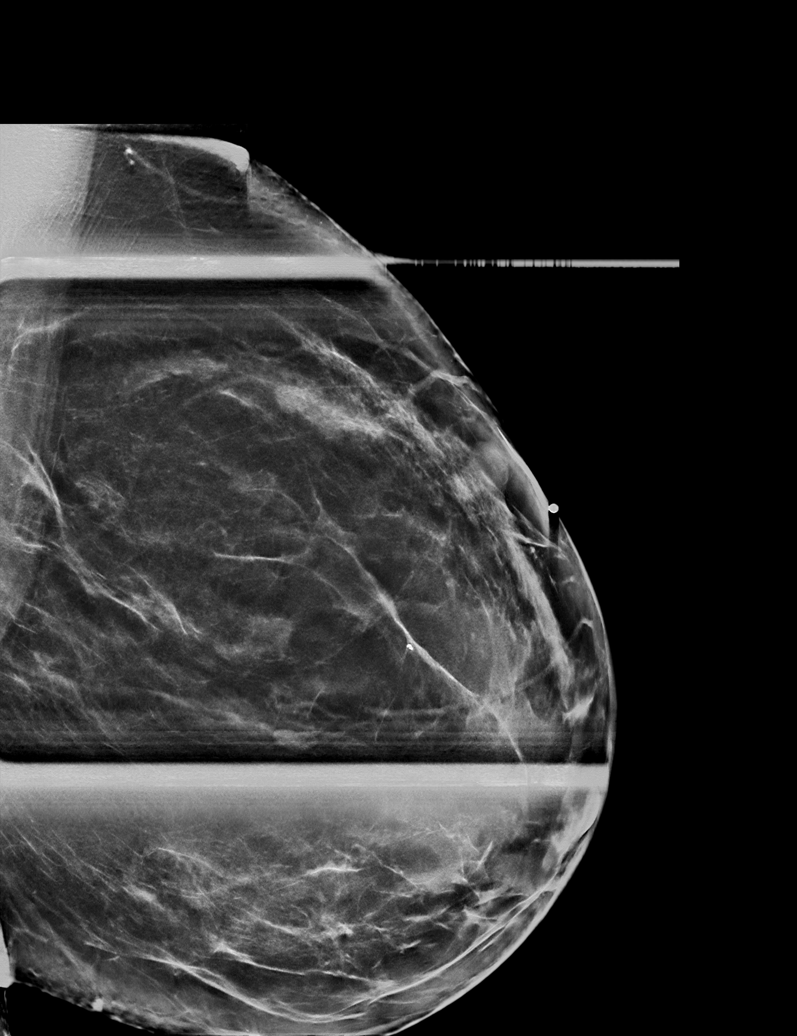

[L MLO tomo · tomo slice 43/84.0]
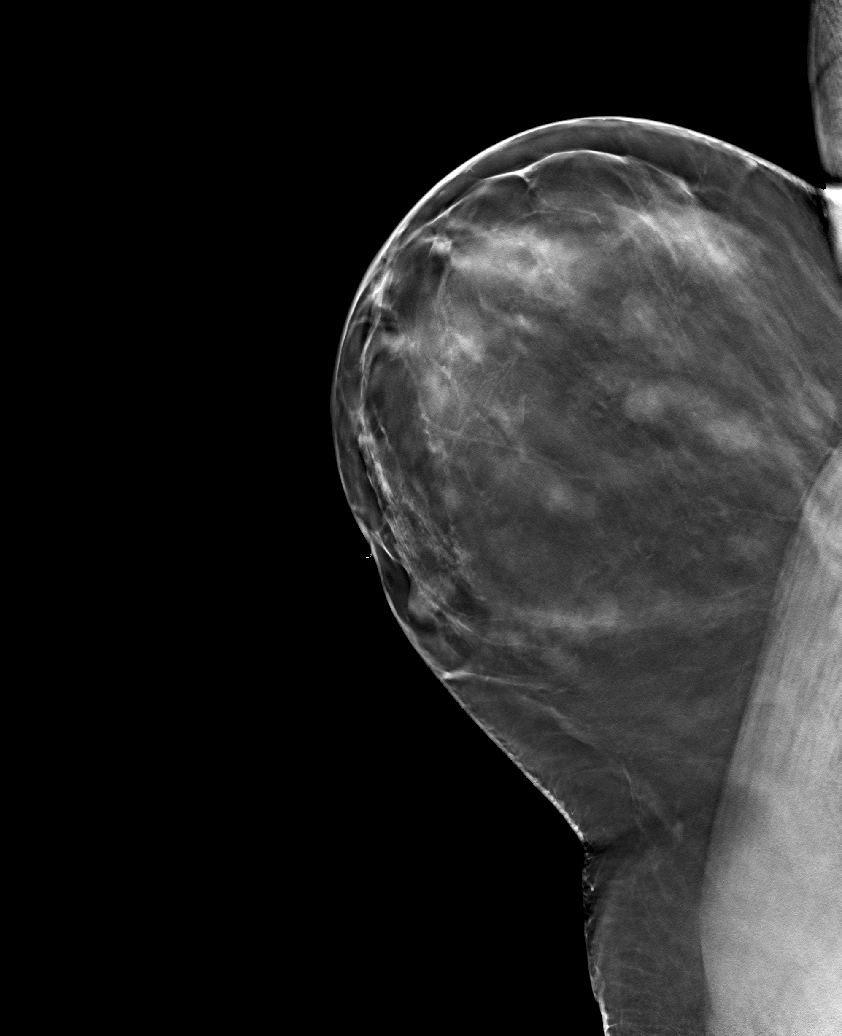

[6 of 30 positions shown; findings below may reference images not displayed]

ACR Breast Density Category c: The breast tissue is heterogeneously
dense, which may obscure small masses.
FINDINGS: Mammographically, there are no suspicious masses or areas of
architectural distortion in the right breast.

In the left breast, upper outer quadrant, middle depth, in
correlation to the area of palpable concern, there is a 1.2 cm
circumscribed bilobed mass.

Mammographic images were processed with CAD.

On physical exam, there is a firm approximately 1 cm mass in the
left breast upper outer quadrant, anterior depth, which corresponds
to the area of palpable concern described by the patient.

Targeted ultrasound is performed, showing left breast 12 o'clock 6
cm from the nipple hypoechoic taller than wide mass containing
single thick septation, which measures 1.1 by 1.5 x 1.1 cm. Internal
blood flow is documented by color Doppler ultrasound. This finding
corresponds to the mammographically seen mass and the area of
palpable concern

There is no evidence of left axillary lymphadenopathy.
IMPRESSION: Suspicious left breast 12 o'clock mass, for which ultrasound-guided
core needle biopsy is recommended.

No evidence of left axillary lymphadenopathy.

RECOMMENDATION:
Ultrasound-guided core needle biopsy of the left breast.

If the pathology results demonstrate malignancy, further evaluation
with breast MRI may be considered.

I have discussed the findings and recommendations with the patient.
Results were also provided in writing at the conclusion of the
visit. If applicable, a reminder letter will be sent to the patient
regarding the next appointment.

BI-RADS CATEGORY  4: Suspicious.

## 2021-05-27 ENCOUNTER — Ambulatory Visit
Admission: EM | Admit: 2021-05-27 | Discharge: 2021-05-27 | Disposition: A | Discharge status: Home / Self Care | Payer: Self-pay | Attending: Emergency Medicine | Admitting: Emergency Medicine

## 2021-05-27 ENCOUNTER — Other Ambulatory Visit: Payer: Self-pay

## 2021-05-27 ENCOUNTER — Encounter: Payer: Self-pay | Admitting: Emergency Medicine

## 2021-05-27 DIAGNOSIS — K122 Cellulitis and abscess of mouth: Secondary | ICD-10-CM

## 2021-05-27 DIAGNOSIS — J0100 Acute maxillary sinusitis, unspecified: Secondary | ICD-10-CM

## 2021-05-27 MED ORDER — IPRATROPIUM BROMIDE 0.06 % NA SOLN: 2.0000 | Freq: Four times a day (QID) | NASAL | 12 refills | Status: DC

## 2021-05-27 MED ORDER — LIDOCAINE VISCOUS HCL 2 % MT SOLN: 15.0000 mL | OROMUCOSAL | 0 refills | Status: DC | PRN

## 2021-05-27 MED ORDER — AMOXICILLIN-POT CLAVULANATE 875-125 MG PO TABS: 1.0000 | ORAL_TABLET | Freq: Two times a day (BID) | ORAL | 0 refills | Status: AC

## 2021-05-27 NOTE — ED Triage Notes (Signed)
Patient reports having cold symptoms for the past 2 weeks.  Patient reports swelling and pain in her face.  Patient reports sinus congestion and pressure.  Patient reports sore throat for 2 days.

## 2021-05-27 NOTE — Discharge Instructions (Addendum)
The Augmentin twice daily with food for 10 days for treatment of your sinusitis.  Perform sinus irrigation 2-3 times a day with a NeilMed sinus rinse kit and distilled water.  Do not use tap water.  You can use plain over-the-counter Mucinex every 6 hours to break up the stickiness of the mucus so your body can clear it.  Increase your oral fluid intake to thin out your mucus so that is also able for your body to clear more easily.  Take an over-the-counter probiotic, such as Culturelle-align-activia, 1 hour after each dose of antibiotic to prevent diarrhea.  Gargle with warm salt water 2-3 times a day to soothe your throat, aid in pain relief, and aid in healing.  Take over-the-counter ibuprofen according to the package instructions as needed for pain.  You can also use Chloraseptic or Sucrets lozenges, 1 lozenge every 2 hours as needed for throat pain.   Gargle with the viscous lidocaine 15 minute before meals for pain relief.  If you develop any new or worsening symptoms return for reevaluation or see your primary care provider.

## 2021-05-27 NOTE — ED Provider Notes (Addendum)
[facility]    CSN: [id number] Arrival date & time: 05/27/21  1041      History   Chief Complaint Chief Complaint  Patient presents with   Sinus Problem   Sore Throat    HPI ROBERTO HLAVATY is a 51 y.o. female.   HPI  51 year old female here for evaluation of respiratory complaints.  Patient reports that for the last 2 weeks she has been experiencing nasal congestion and sinus pressure that is causing her upper teeth to be painful.  She states that when she blows her nose she is experiencing popping in her ears but does not have any ear pain.  She is experiencing postnasal drip and that has caused her to have a sore throat.  In the last 4 days her throat has become more sore and she feels like her uvula is swollen.  She denies any fever.  Past Medical History:  Diagnosis Date   Cholelithiasis    Depression    GERD (gastroesophageal reflux disease)    Heart palpitations    Hypertension    Muscle spasm    PTSD (post-traumatic stress disorder)    Thyroid disease    Hypothyroidism   Vertigo     Patient Active Problem List   Diagnosis Date Noted   Gallstones 11/02/2013    Past Surgical History:  Procedure Laterality Date   BREAST BIOPSY Left 2006   neg- done in Dr. Festus Aloe office   BREAST LUMPECTOMY  2006   CESAREAN SECTION  00,2006   TUBAL LIGATION  2006    OB History     Gravida  2   Para  2   Term      Preterm      AB      Living  2      SAB      IAB      Ectopic      Multiple      Live Births           Obstetric Comments  1st Menstrual Cycle:  14 1st Pregnancy:  28          Home Medications    Prior to Admission medications   Medication Sig Start Date End Date Taking? Authorizing Provider  amoxicillin-clavulanate (AUGMENTIN) 875-125 MG tablet Take 1 tablet by mouth every 12 (twelve) hours for 10 days. 05/27/21 06/06/21 Yes Margarette Canada, NP  ipratropium (ATROVENT) 0.06 % nasal spray Place 2 sprays into both  nostrils 4 (four) times daily. 05/27/21  Yes Margarette Canada, NP  lidocaine (XYLOCAINE) 2 % solution Use as directed 15 mLs in the mouth or throat as needed for mouth pain. 05/27/21  Yes Margarette Canada, NP  acetaminophen (TYLENOL) 500 MG tablet Take 1,000 mg by mouth every 6 (six) hours as needed.    [provider]  benzonatate (TESSALON) 200 MG capsule Take 1 capsule (200 mg total) by mouth 3 (three) times daily as needed for cough. 07/02/17   Marylene Land, NP  busPIRone (BUSPAR) 7.5 MG tablet Take 7.5 mg by mouth 3 (three) times daily.    [provider]  butalbital-acetaminophen-caffeine (FIORICET WITH CODEINE) 50-325-40-30 MG capsule Take 1-2 capsules by mouth every 4 (four) hours as needed for headache.    [provider]  hydrochlorothiazide (MICROZIDE) 12.5 MG capsule Take 12.5 mg by mouth daily.    [provider]  hydrOXYzine (ATARAX/VISTARIL) 50 MG tablet Take 25-50 mg by mouth as needed.    [provider]  levothyroxine (SYNTHROID, LEVOTHROID) 125 MCG tablet Take 1 tablet by mouth daily.    [provider]  pantoprazole (PROTONIX) 40 MG tablet Take 40 mg by mouth daily.    [provider]  ranitidine (ZANTAC) 150 MG tablet Take 150 mg by mouth 2 (two) times daily.    [provider]  sucralfate (CARAFATE) 1 g tablet Take 1 tablet (1 g total) by mouth 2 (two) times daily for 14 days. 01/17/18 01/31/18  Merlyn Lot, MD  sucralfate (CARAFATE) 1 g tablet Take 1 tablet (1 g total) by mouth 4 (four) times daily. 11/04/18   Nance Pear, MD  zolpidem (AMBIEN) 5 MG tablet Take 5 mg by mouth at bedtime as needed for sleep.    [provider]    Family History Family History  Problem Relation Age of Onset   Diabetes Father    Heart disease Father    Breast cancer Neg Hx     Social History Social History   Tobacco Use   Smoking status: Former    Packs/day: 0.50    Years: 18.00    Pack years: 9.00     Types: Cigarettes   Smokeless tobacco: Never  Vaping Use   Vaping Use: Never used  Substance Use Topics   Alcohol use: No   Drug use: No     Allergies   Zofran [ondansetron hcl], Codeine, Neomycin, and Vicodin [hydrocodone-acetaminophen]   Review of Systems Review of Systems  Constitutional:  Negative for fever.  HENT:  Positive for congestion, sinus pressure and sore throat. Negative for ear discharge and ear pain.   Respiratory:  Negative for cough.   Hematological: Negative.   Psychiatric/Behavioral: Negative.      Physical Exam Triage Vital Signs ED Triage Vitals  Enc Vitals Group     BP 05/27/21 1053 (!) 146/88     Pulse Rate 05/27/21 1053 (!) 101     Resp 05/27/21 1053 14     Temp 05/27/21 1053 98.2 F (36.8 C)     Temp Source 05/27/21 1053 Oral     SpO2 05/27/21 1053 99 %     Weight 05/27/21 1050 232 lb (105.2 kg)     Height 05/27/21 1050 '5\' 6"'  (1.676 m)     Head Circumference --      Peak Flow --      Pain Score 05/27/21 1050 7     Pain Loc --      Pain Edu? --      Excl. in [place]? --    No data found.  Updated Vital Signs BP (!) 146/88 (BP Location: Left Arm)    Pulse (!) 101    Temp 98.2 F (36.8 C) (Oral)    Resp 14    Ht '5\' 6"'  (1.676 m)    Wt 232 lb (105.2 kg)    LMP 04/28/2021 (Approximate)    SpO2 99%    BMI 37.45 kg/m   Visual Acuity Right Eye Distance:   Left Eye Distance:   Bilateral Distance:    Right Eye Near:   Left Eye Near:    Bilateral Near:     Physical Exam Vitals and nursing note reviewed.  Constitutional:      General: She is not in acute distress.    Appearance: Normal appearance. She is not ill-appearing.  HENT:     Head: Normocephalic and atraumatic.     Right Ear: Tympanic membrane, ear canal and external ear normal. There is no impacted cerumen.  Left Ear: Tympanic membrane, ear canal and external ear normal. There is no impacted cerumen.     Nose: Congestion and rhinorrhea present.     Mouth/Throat:     Mouth:  Mucous membranes are moist.     Pharynx: Posterior oropharyngeal erythema present.  Cardiovascular:     Rate and Rhythm: Normal rate and regular rhythm.     Pulses: Normal pulses.     Heart sounds: Normal heart sounds. No murmur heard.   No friction rub. No gallop.  Pulmonary:     Effort: Pulmonary effort is normal.     Breath sounds: Normal breath sounds. No wheezing, rhonchi or rales.  Musculoskeletal:     Cervical back: Normal range of motion and neck supple.  Lymphadenopathy:     Cervical: No cervical adenopathy.  Skin:    General: Skin is warm and dry.     Capillary Refill: Capillary refill takes less than 2 seconds.     Findings: No erythema or rash.  Neurological:     General: No focal deficit present.     Mental Status: She is alert and oriented to person, place, and time.  Psychiatric:        Mood and Affect: Mood normal.        Behavior: Behavior normal.        Thought Content: Thought content normal.        Judgment: Judgment normal.     UC Treatments / Results  Labs (all labs ordered are listed, but only abnormal results are displayed) Labs Reviewed - No data to display  EKG   Radiology No results found.  Procedures Procedures (including critical care time)  Medications Ordered in UC Medications - No data to display  Initial Impression / Assessment and Plan / UC Course  I have reviewed the triage vital signs and the nursing notes.  Pertinent labs & imaging results that were available during my care of the patient were reviewed by me and considered in my medical decision making (see chart for details).  51 year old female here for evaluation of respiratory complaints as outlined in HPI above.  Her most prominent complaint is her sore throat which began 4 days ago.  She has not been febrile.  On exam patient has a pearly-gray tympanic membranes bilaterally.  Her nasal mucosa is erythematous and edematous with purulent green discharge in both nares.   Patient does have tenderness to percussion of maxillary sinuses bilaterally.  Oropharyngeal exam reveals erythema of the soft palate and edema of the uvula.  Tonsillar pillars are also edematous and erythematous but are free of exudate.  Patient is unwilling to be swabbed for strep and states that she has to have Valium to have a dental cleaning.  I will treat patient for sinusitis and uvulitis with Augmentin twice a day for 10 days.  I will also prescribe viscous lidocaine for the patient to use 50 minutes before meals and at bedtime as needed for severe sore throat pain.    Final diagnoses:  Acute non-recurrent maxillary sinusitis  Uvulitis     Discharge Instructions      The Augmentin twice daily with food for 10 days for treatment of your sinusitis.  Perform sinus irrigation 2-3 times a day with a NeilMed sinus rinse kit and distilled water.  Do not use tap water.  You can use plain over-the-counter Mucinex every 6 hours to break up the stickiness of the mucus so your body can clear it.  Increase your  oral fluid intake to thin out your mucus so that is also able for your body to clear more easily.  Take an over-the-counter probiotic, such as Culturelle-align-activia, 1 hour after each dose of antibiotic to prevent diarrhea.  Gargle with warm salt water 2-3 times a day to soothe your throat, aid in pain relief, and aid in healing.  Take over-the-counter ibuprofen according to the package instructions as needed for pain.  You can also use Chloraseptic or Sucrets lozenges, 1 lozenge every 2 hours as needed for throat pain.   Gargle with the viscous lidocaine 15 minute before meals for pain relief.  If you develop any new or worsening symptoms return for reevaluation or see your primary care provider.      ED Prescriptions     Medication Sig Dispense Auth. Provider   amoxicillin-clavulanate (AUGMENTIN) 875-125 MG tablet Take 1 tablet by mouth every 12 (twelve) hours for 10  days. 20 tablet Margarette Canada, NP   ipratropium (ATROVENT) 0.06 % nasal spray Place 2 sprays into both nostrils 4 (four) times daily. 15 mL Margarette Canada, NP   lidocaine (XYLOCAINE) 2 % solution Use as directed 15 mLs in the mouth or throat as needed for mouth pain. 100 mL Margarette Canada, NP      PDMP not reviewed this encounter.   Margarette Canada, NP 05/27/21 1152    Margarette Canada, NP 05/27/21 1153

## 2021-05-31 ENCOUNTER — Other Ambulatory Visit: Payer: Self-pay

## 2021-05-31 ENCOUNTER — Ambulatory Visit
Admission: EM | Admit: 2021-05-31 | Discharge: 2021-05-31 | Disposition: A | Discharge status: Home / Self Care | Payer: Self-pay

## 2021-05-31 DIAGNOSIS — M26609 Unspecified temporomandibular joint disorder, unspecified side: Secondary | ICD-10-CM

## 2021-05-31 NOTE — Discharge Instructions (Signed)
Apply topical voltaren gel, and pea size amount, to your joint 4 times a day as needed.  Eat soft foods.  Avoid chewing gum or eating hard candy.  If your symptoms continue I recommend following up with your dentist to discuss treatment options such as a night guard.

## 2021-05-31 NOTE — ED Triage Notes (Signed)
Pt reports right ear pain radiates to jaw since this morning. Pt reports she started taking Augmentin on 05/27/21 for Strep

## 2021-05-31 NOTE — ED Provider Notes (Signed)
[facility]    CSN: [id number] Arrival date & time: 05/31/21  0949      History   Chief Complaint Chief Complaint  Patient presents with   Otalgia    HPI Gabriella Chambers is a 51 y.o. female.   HPI  51 year old female here for evaluation of right ear pain.  Patient is currently being treated for sinusitis and strep throat and is on Augmentin twice daily.  She reports that when she woke this morning she was experiencing pain in her right ear that radiated to her jaw.  She does endorse ringing in her ear but states that that is not new.  She denies any drainage from her ear.  Past Medical History:  Diagnosis Date   Cholelithiasis    Depression    GERD (gastroesophageal reflux disease)    Heart palpitations    Hypertension    Muscle spasm    PTSD (post-traumatic stress disorder)    Thyroid disease    Hypothyroidism   Vertigo     Patient Active Problem List   Diagnosis Date Noted   Gallstones 11/02/2013    Past Surgical History:  Procedure Laterality Date   BREAST BIOPSY Left 2006   neg- done in Dr. Purvis [place]nette's office   BREAST LUMPECTOMY  2006   CESAREAN SECTION  00,2006   TUBAL LIGATION  2006    OB History     Gravida  2   Para  2   Term      Preterm      AB      Living  2      SAB      IAB      Ectopic      Multiple      Live Births           Obstetric Comments  1st Menstrual Cycle:  14 1st Pregnancy:  28          Home Medications    Prior to Admission medications   Medication Sig Start Date End Date Taking? Authorizing Provider  acetaminophen (TYLENOL) 500 MG tablet Take 1,000 mg by mouth every 6 (six) hours as needed.    [provider]  amoxicillin-clavulanate (AUGMENTIN) 875-125 MG tablet Take 1 tablet by mouth every 12 (twelve) hours for 10 days. 05/27/21 06/06/21  Becky Augustayan, Milika Ventress, NP  benzonatate (TESSALON) 200 MG capsule Take 1 capsule (200 mg total) by mouth 3 (three) times daily as needed for cough.  07/02/17   Renford DillsMiller, [place], NP  busPIRone (BUSPAR) 7.5 MG tablet Take 7.5 mg by mouth 3 (three) times daily.    [provider]  butalbital-acetaminophen-caffeine (FIORICET WITH CODEINE) 50-325-40-30 MG capsule Take 1-2 capsules by mouth every 4 (four) hours as needed for headache.    [provider]  hydrochlorothiazide (MICROZIDE) 12.5 MG capsule Take 12.5 mg by mouth daily.    [provider]  hydrOXYzine (ATARAX/VISTARIL) 50 MG tablet Take 25-50 mg by mouth as needed.    [provider]  ipratropium (ATROVENT) 0.06 % nasal spray Place 2 sprays into both nostrils 4 (four) times daily. 05/27/21   Becky Augustayan, Zakaria Fromer, NP  levothyroxine (SYNTHROID, LEVOTHROID) 125 MCG tablet Take 1 tablet by mouth daily.    [provider]  lidocaine (XYLOCAINE) 2 % solution Use as directed 15 mLs in the mouth or throat as needed for mouth pain. 05/27/21   Becky Augustayan, Jaques Mineer, NP  pantoprazole (PROTONIX) 40 MG tablet Take 40 mg by mouth daily.  [provider]  ranitidine (ZANTAC) 150 MG tablet Take 150 mg by mouth 2 (two) times daily.    [provider]  sucralfate (CARAFATE) 1 g tablet Take 1 tablet (1 g total) by mouth 2 (two) times daily for 14 days. 01/17/18 01/31/18  Willy Eddy, MD  sucralfate (CARAFATE) 1 g tablet Take 1 tablet (1 g total) by mouth 4 (four) times daily. 11/04/18   Phineas Semen, MD  zolpidem (AMBIEN) 5 MG tablet Take 5 mg by mouth at bedtime as needed for sleep.    [provider]    Family History Family History  Problem Relation Age of Onset   Diabetes Father    Heart disease Father    Breast cancer Neg Hx     Social History Social History   Tobacco Use   Smoking status: Former    Packs/day: 0.50    Years: 18.00    Pack years: 9.00    Types: Cigarettes   Smokeless tobacco: Never  Vaping Use   Vaping Use: Never used  Substance Use Topics   Alcohol use: No   Drug use: No     Allergies   Zofran [ondansetron  hcl], Codeine, Neomycin, and Vicodin [hydrocodone-acetaminophen]   Review of Systems Review of Systems  Constitutional:  Negative for activity change, appetite change and fever.  HENT:  Positive for ear pain and tinnitus. Negative for ear discharge.     Physical Exam Triage Vital Signs ED Triage Vitals  Enc Vitals Group     BP 05/31/21 1003 138/89     Pulse Rate 05/31/21 1003 91     Resp 05/31/21 1003 17     Temp 05/31/21 1003 98.6 F (37 C)     Temp Source 05/31/21 1003 Oral     SpO2 05/31/21 1003 100 %     Weight --      Height --      Head Circumference --      Peak Flow --      Pain Score 05/31/21 1005 6     Pain Loc --      Pain Edu? --      Excl. in GC? --    No data found.  Updated Vital Signs BP 138/89 (BP Location: Right Arm)    Pulse 91    Temp 98.6 F (37 C) (Oral)    Resp 17    SpO2 100%   Visual Acuity Right Eye Distance:   Left Eye Distance:   Bilateral Distance:    Right Eye Near:   Left Eye Near:    Bilateral Near:     Physical Exam Vitals and nursing note reviewed.  Constitutional:      General: She is not in acute distress.    Appearance: Normal appearance. She is not ill-appearing.  HENT:     Head: Normocephalic and atraumatic.     Right Ear: Tympanic membrane, ear canal and external ear normal. There is no impacted cerumen.     Left Ear: Tympanic membrane, ear canal and external ear normal. There is no impacted cerumen.  Musculoskeletal:        General: Tenderness present.  Skin:    General: Skin is warm and dry.     Capillary Refill: Capillary refill takes less than 2 seconds.     Findings: No erythema or rash.  Neurological:     General: No focal deficit present.     Mental Status: She is alert and oriented to person, place,  and time.  Psychiatric:        Mood and Affect: Mood normal.        Behavior: Behavior normal.        Thought Content: Thought content normal.        Judgment: Judgment normal.     UC Treatments / Results   Labs (all labs ordered are listed, but only abnormal results are displayed) Labs Reviewed - No data to display  EKG   Radiology No results found.  Procedures Procedures (including critical care time)  Medications Ordered in UC Medications - No data to display  Initial Impression / Assessment and Plan / UC Course  I have reviewed the triage vital signs and the nursing notes.  Pertinent labs & imaging results that were available during my care of the patient were reviewed by me and considered in my medical decision making (see chart for details).  Very pleasant, nontoxic-appearing 51 year old female who is currently on antibiotic therapy for treatment of sinus infection and strep throat presenting for evaluation of acute onset of right ear pain that started this morning.  She denies any drainage from the ear but she states that she thought she felt something wet in her ear.  She does have history of eczema and she is concerned she may have scratched her ear canal and may be developing an infection.  On exam there is no drainage from either external auditory canal.  Both canals are clear to examination with otoscope and speculum.  Both tympanic membranes are pearly gray with normal light reflex as well.  Patient does have tenderness when palpating anterior the tragus of her right ear over the TMJ.  With mandibular range of motion she does have popping and clicking and bilateral TM joints.  Patient states that she does not chew ice, crunchy foods, or chew gum.  She does endorse possibly clenching or grinding in her sleep of her teeth.  I suspect patient has TMJ syndrome.  I have advised her to use over-the-counter Voltaren gel, a pea-sized amount, apply directly to her TM joint 4 times a day as needed for pain.  She is to avoid hard proteins or crunchy foods for the time being to decrease inflammation or gum.  I have also advised her that she should avoid hard candy or gum chewing as well.  If the  pain persists she should follow-up with her dentist as she may need a nightguard made to help her deal with her clenching and grinding at night.   Final Clinical Impressions(s) / UC Diagnoses   Final diagnoses:  TMJ (temporomandibular joint syndrome)     Discharge Instructions      Apply topical voltaren gel, and pea size amount, to your joint 4 times a day as needed.  Eat soft foods.  Avoid chewing gum or eating hard candy.  If your symptoms continue I recommend following up with your dentist to discuss treatment options such as a night guard.      ED Prescriptions   None    PDMP not reviewed this encounter.   Becky Augusta, NP 05/31/21 1100

## 2021-06-18 ENCOUNTER — Ambulatory Visit
Admission: EM | Admit: 2021-06-18 | Discharge: 2021-06-18 | Disposition: A | Discharge status: Home / Self Care | Payer: Self-pay | Attending: Nurse Practitioner | Admitting: Nurse Practitioner

## 2021-06-18 ENCOUNTER — Encounter: Payer: Self-pay | Admitting: Emergency Medicine

## 2021-06-18 ENCOUNTER — Other Ambulatory Visit: Payer: Self-pay

## 2021-06-18 DIAGNOSIS — L259 Unspecified contact dermatitis, unspecified cause: Secondary | ICD-10-CM

## 2021-06-18 MED ORDER — METHYLPREDNISOLONE 4 MG PO TBPK: ORAL_TABLET | ORAL | 0 refills | Status: DC

## 2021-06-18 NOTE — ED Triage Notes (Signed)
Patient c/o itchy red bumps all over her body that started after she finished her antibiotic for strep throat.

## 2021-06-18 NOTE — Discharge Instructions (Addendum)
Take medications as prescribed  You may also take over-the-counter benadryl as needed for itching  Moisturize your skin as needed. Put cool cloths on your skin. Do not scratch your skin. Avoid having things rub up against your skin. Avoid the use of soaps, perfumes, and dyes. Use fragrance free products to the skin  Follow-up with dermatology or allergist if symptoms do not get better   Go to the ED immediately if:  You have a very bad headache. You have neck pain. Your neck is stiff. You throw up (vomit). You feel very sleepy. You see red streaks coming from the area. Your bone or joint near the area hurts after the skin has healed. The area turns darker. You have trouble breathing.

## 2021-06-18 NOTE — ED Provider Notes (Addendum)
[facility]    CSN: [id number] Arrival date & time: 06/18/21  1115      History   Chief Complaint Chief Complaint  Patient presents with   Rash    HPI Gabriella Chambers is a 51 y.o. female.   Subjective:   Gabriella Chambers is a 51 y.o. female who presents for evaluation of rash. Rash started several days ago. Initial distribution: generalized. Lesions are pink in color and are of raised texture. Rash has not changed over time.  Rash is pruritic. Patient denies: fever, headache, and sore throat. Patient has not had previous evaluation of rash. Patient has not had previous treatment. Patient has not had contacts with similar rash. Patient has not had new exposures; however, she recently completed a 10-day course of Augmentin for a sinus infection/pharyngitis.  Patient has had this medication in the past without any similar reaction.  Patient has not tried anything for her symptoms.  The following portions of the patient's history were reviewed and updated as appropriate: allergies, current medications, past family history, past medical history, past social history, past surgical history, and problem list.    Past Medical History:  Diagnosis Date   Cholelithiasis    Depression    GERD (gastroesophageal reflux disease)    Heart palpitations    Hypertension    Muscle spasm    PTSD (post-traumatic stress disorder)    Thyroid disease    Hypothyroidism   Vertigo     Patient Active Problem List   Diagnosis Date Noted   Gallstones 11/02/2013    Past Surgical History:  Procedure Laterality Date   BREAST BIOPSY Left 2006   neg- done in Dr. Purvis [place] office   BREAST LUMPECTOMY  2006   CESAREAN SECTION  00,2006   TUBAL LIGATION  2006    OB History     Gravida  2   Para  2   Term      Preterm      AB      Living  2      SAB      IAB      Ectopic      Multiple      Live Births           Obstetric Comments  1st Menstrual Cycle:  14 1st  Pregnancy:  28          Home Medications    Prior to Admission medications   Medication Sig Start Date End Date Taking? Authorizing Provider  methylPREDNISolone (MEDROL DOSEPAK) 4 MG TBPK tablet Take as prescribed 06/18/21  Yes Lurline Idol, FNP  acetaminophen (TYLENOL) 500 MG tablet Take 1,000 mg by mouth every 6 (six) hours as needed.    [provider]  benzonatate (TESSALON) 200 MG capsule Take 1 capsule (200 mg total) by mouth 3 (three) times daily as needed for cough. 07/02/17   Renford Dills, NP  busPIRone (BUSPAR) 7.5 MG tablet Take 7.5 mg by mouth 3 (three) times daily.    [provider]  butalbital-acetaminophen-caffeine (FIORICET WITH CODEINE) 50-325-40-30 MG capsule Take 1-2 capsules by mouth every 4 (four) hours as needed for headache.    [provider]  hydrochlorothiazide (MICROZIDE) 12.5 MG capsule Take 12.5 mg by mouth daily.    [provider]  hydrOXYzine (ATARAX/VISTARIL) 50 MG tablet Take 25-50 mg by mouth as needed.    [provider]  ipratropium (ATROVENT) 0.06 % nasal spray Place 2 sprays into both nostrils 4 (  four) times daily. 05/27/21   Becky Augusta, NP  levothyroxine (SYNTHROID, LEVOTHROID) 125 MCG tablet Take 1 tablet by mouth daily.    [provider]  lidocaine (XYLOCAINE) 2 % solution Use as directed 15 mLs in the mouth or throat as needed for mouth pain. 05/27/21   Becky Augusta, NP  pantoprazole (PROTONIX) 40 MG tablet Take 40 mg by mouth daily.    [provider]  ranitidine (ZANTAC) 150 MG tablet Take 150 mg by mouth 2 (two) times daily.    [provider]  sucralfate (CARAFATE) 1 g tablet Take 1 tablet (1 g total) by mouth 2 (two) times daily for 14 days. 01/17/18 01/31/18  Willy Eddy, MD  sucralfate (CARAFATE) 1 g tablet Take 1 tablet (1 g total) by mouth 4 (four) times daily. 11/04/18   Phineas Semen, MD  zolpidem (AMBIEN) 5 MG tablet Take 5 mg by mouth at bedtime as  needed for sleep.    [provider]    Family History Family History  Problem Relation Age of Onset   Diabetes Father    Heart disease Father    Breast cancer Neg Hx     Social History Social History   Tobacco Use   Smoking status: Former    Packs/day: 0.50    Years: 18.00    Pack years: 9.00    Types: Cigarettes   Smokeless tobacco: Never  Vaping Use   Vaping Use: Never used  Substance Use Topics   Alcohol use: No   Drug use: No     Allergies   Zofran [ondansetron hcl], Codeine, Neomycin, and Vicodin [hydrocodone-acetaminophen]   Review of Systems Review of Systems  Constitutional:  Negative for fever.  HENT:  Negative for sore throat and trouble swallowing.   Skin:  Positive for rash.  All other systems reviewed and are negative.   Physical Exam Triage Vital Signs ED Triage Vitals  Enc Vitals Group     BP 06/18/21 1201 (!) 143/90     Pulse Rate 06/18/21 1201 81     Resp 06/18/21 1201 14     Temp 06/18/21 1201 99 F (37.2 C)     Temp Source 06/18/21 1201 Oral     SpO2 06/18/21 1201 100 %     Weight 06/18/21 1159 240 lb (108.9 kg)     Height 06/18/21 1159 5\' 6"  (1.676 m)     Head Circumference --      Peak Flow --      Pain Score 06/18/21 1159 0     Pain Loc --      Pain Edu? --      Excl. in GC? --    No data found.  Updated Vital Signs BP (!) 143/90 (BP Location: Right Arm)    Pulse 81    Temp 99 F (37.2 C) (Oral)    Resp 14    Ht 5\' 6"  (1.676 m)    Wt 240 lb (108.9 kg)    SpO2 100%    BMI 38.74 kg/m   Visual Acuity Right Eye Distance:   Left Eye Distance:   Bilateral Distance:    Right Eye Near:   Left Eye Near:    Bilateral Near:     Physical Exam Vitals reviewed.  Constitutional:      Appearance: Normal appearance.  HENT:     Head: Normocephalic.     Nose: Nose normal.     Mouth/Throat:     Lips: Pink.  Mouth: Mucous membranes are moist.     Pharynx: Oropharynx is clear. Uvula midline. No pharyngeal swelling.   Eyes:     Conjunctiva/sclera: Conjunctivae normal.  Cardiovascular:     Rate and Rhythm: Normal rate.  Pulmonary:     Effort: Pulmonary effort is normal.  Musculoskeletal:        General: Normal range of motion.     Cervical back: Normal range of motion and neck supple.  Skin:    General: Skin is warm and dry.     Findings: Rash present.  Neurological:     General: No focal deficit present.     Mental Status: She is alert and oriented to person, place, and time.  Psychiatric:        Mood and Affect: Mood normal.        Behavior: Behavior normal.     UC Treatments / Results  Labs (all labs ordered are listed, but only abnormal results are displayed) Labs Reviewed - No data to display  EKG   Radiology No results found.  Procedures Procedures (including critical care time)  Medications Ordered in UC Medications - No data to display  Initial Impression / Assessment and Plan / UC Course  I have reviewed the triage vital signs and the nursing notes.  Pertinent labs & imaging results that were available during my care of the patient were reviewed by me and considered in my medical decision making (see chart for details).    51 year old female presenting with diffuse rash throughout her entire body for the past several days.  She notably completed a 10-day course of Augmentin recently and is concerned that this may be due to an a reaction to the antibiotic.  She has had penicillin agents in the past without similar reactions.  She denies any other new exposures.  No sore throat, difficulty swallowing or throat swelling.  She has not tried anything for symptoms.  She reports that her URI/sinus symptoms have resolved.  Denies any fevers but temperature here is 99 degrees.  All other vital signs stable.  She is nontoxic appearing.  Will treat as a contact dermatitis with a course of steroids.  Offered patient injection of steroids in the clinic but she declined.  She is also  advised to take over-the-counter Benadryl per label instructions as needed for itching.  Supportive care measures and indications for immediate follow-up discussed.  Follow-up with allergist or dermatology if symptoms fail to improve.  Today's evaluation has revealed no signs of a dangerous process. Discussed diagnosis with patient and/or guardian. Patient and/or guardian aware of their diagnosis, possible red flag symptoms to watch out for and need for close follow up. Patient and/or guardian understands verbal and written discharge instructions. Patient and/or guardian comfortable with plan and disposition.  Patient and/or guardian has a clear mental status at this time, good insight into illness (after discussion and teaching) and has clear judgment to make decisions regarding their care  This care was provided during an unprecedented National Emergency due to the Novel Coronavirus (COVID-19) pandemic. COVID-19 infections and transmission risks place heavy strains on healthcare resources.  As this pandemic evolves, our facility, providers, and staff strive to respond fluidly, to remain operational, and to provide care relative to available resources and information. Outcomes are unpredictable and treatments are without well-defined guidelines. Further, the impact of COVID-19 on all aspects of urgent care, including the impact to patients seeking care for reasons other than COVID-19, is unavoidable during this national  emergency. At this time of the global pandemic, management of patients has significantly changed, even for non-COVID positive patients given high local and regional COVID volumes at this time requiring high healthcare system and resource utilization. The standard of care for management of both COVID suspected and non-COVID suspected patients continues to change rapidly at the local, regional, national, and global levels. This patient was worked up and treated to the best available but ever  changing evidence and resources available at this current time.   Documentation was completed with the aid of voice recognition software. Transcription may contain typographical errors.     Final Clinical Impressions(s) / UC Diagnoses   Final diagnoses:  Contact dermatitis, unspecified contact dermatitis type, unspecified trigger     Discharge Instructions      Take medications as prescribed  You may also take over-the-counter benadryl as needed for itching  Moisturize your skin as needed. Put cool cloths on your skin. Do not scratch your skin. Avoid having things rub up against your skin. Avoid the use of soaps, perfumes, and dyes. Use fragrance free products to the skin  Follow-up with dermatology or allergist if symptoms do not get better   Go to the ED immediately if:  You have a very bad headache. You have neck pain. Your neck is stiff. You throw up (vomit). You feel very sleepy. You see red streaks coming from the area. Your bone or joint near the area hurts after the skin has healed. The area turns darker. You have trouble breathing.   ED Prescriptions     Medication Sig Dispense Auth. Provider   methylPREDNISolone (MEDROL DOSEPAK) 4 MG TBPK tablet Take as prescribed 21 tablet Lurline Idol, FNP      PDMP not reviewed this encounter.   Lurline Idol, FNP 06/18/21 1416    Lurline Idol, [place] 06/18/21 1417

## 2021-11-24 ENCOUNTER — Ambulatory Visit
Admission: EM | Admit: 2021-11-24 | Discharge: 2021-11-24 | Disposition: A | Discharge status: Home / Self Care | Payer: Self-pay

## 2021-11-24 DIAGNOSIS — H8110 Benign paroxysmal vertigo, unspecified ear: Secondary | ICD-10-CM

## 2021-11-24 MED ORDER — MECLIZINE HCL 12.5 MG PO TABS: 12.5000 mg | ORAL_TABLET | Freq: Three times a day (TID) | ORAL | 0 refills | Status: AC | PRN

## 2021-11-24 NOTE — Discharge Instructions (Addendum)
Use the Meclizine 12.5 mg during the day and 25 mg at night as needed for dizziness.  Use OTC Tylenol and Ibuprofen as needed for headache.  Schedule an appointment with your ENT/Vertigo specialist next week.  If you develop a worsening headache, changes in mental status, or dizziness you need to go to the ER.

## 2021-11-24 NOTE — ED Triage Notes (Signed)
Patient presents to [facility] for ear pressure and ears are very sensitive -- started Tuesday.  Patients reports she does have vertigo and that has been acting up.

## 2021-11-24 NOTE — ED Provider Notes (Signed)
[facility]    CSN: [id number] Arrival date & time: 11/24/21  1701      History   Chief Complaint Chief Complaint  Patient presents with   ear pressure    bilateral    HPI Gabriella Chambers is a 51 y.o. female.   HPI  52 year old female here for evaluation of bilateral ear pressure.  Patient reports that she has been experiencing bilateral ear pressure, sound sensitivity, ringing in her ears, and intermittent headache for the past 3 days.  She denies any fever, runny nose, nasal congestion, or decreased hearing.  She states that her headache mostly occurs when she bends over and is better when she stands back up.  She does have a history of vertigo and she has been taking 25 mg of meclizine once daily without significant improvement of her symptoms.  She denies any numbness, tingling, or weakness in any of her extremities.  Past Medical History:  Diagnosis Date   Cholelithiasis    Depression    GERD (gastroesophageal reflux disease)    Heart palpitations    Hypertension    Muscle spasm    PTSD (post-traumatic stress disorder)    Thyroid disease    Hypothyroidism   Vertigo     Patient Active Problem List   Diagnosis Date Noted   Gallstones 11/02/2013    Past Surgical History:  Procedure Laterality Date   BREAST BIOPSY Left 2006   neg- done in Dr. Purvis [place] office   BREAST LUMPECTOMY  2006   CESAREAN SECTION  00,2006   TUBAL LIGATION  2006    OB History     Gravida  2   Para  2   Term      Preterm      AB      Living  2      SAB      IAB      Ectopic      Multiple      Live Births           Obstetric Comments  1st Menstrual Cycle:  14 1st Pregnancy:  28          Home Medications    Prior to Admission medications   Medication Sig Start Date End Date Taking? Authorizing Provider  meclizine (ANTIVERT) 12.5 MG tablet Take 1 tablet (12.5 mg total) by mouth 3 (three) times daily as needed for dizziness. 11/24/21  Yes  Becky Augusta, NP  acetaminophen (TYLENOL) 500 MG tablet Take 1,000 mg by mouth every 6 (six) hours as needed.    [provider]  amLODipine (NORVASC) 5 MG tablet Take 5 mg by mouth daily. 11/15/21   [provider]  benzonatate (TESSALON) 200 MG capsule Take 1 capsule (200 mg total) by mouth 3 (three) times daily as needed for cough. 07/02/17   Renford Dills, NP  busPIRone (BUSPAR) 7.5 MG tablet Take 7.5 mg by mouth 3 (three) times daily.    [provider]  butalbital-acetaminophen-caffeine (FIORICET WITH CODEINE) 50-325-40-30 MG capsule Take 1-2 capsules by mouth every 4 (four) hours as needed for headache.    [provider]  hydrOXYzine (ATARAX/VISTARIL) 50 MG tablet Take 25-50 mg by mouth as needed.    [provider]  ipratropium (ATROVENT) 0.06 % nasal spray Place 2 sprays into both nostrils 4 (four) times daily. 05/27/21   Becky Augusta, NP  levothyroxine (SYNTHROID, LEVOTHROID) 125 MCG tablet Take 1 tablet by mouth daily.    [provider]  lidocaine (XYLOCAINE) 2 % solution Use as directed 15 mLs in the mouth or throat as needed for mouth pain. 05/27/21   Becky Augusta, NP  pantoprazole (PROTONIX) 40 MG tablet Take 40 mg by mouth daily.    [provider]  ranitidine (ZANTAC) 150 MG tablet Take 150 mg by mouth 2 (two) times daily.    [provider]  sucralfate (CARAFATE) 1 g tablet Take 1 tablet (1 g total) by mouth 2 (two) times daily for 14 days. 01/17/18 01/31/18  Willy Eddy, MD  sucralfate (CARAFATE) 1 g tablet Take 1 tablet (1 g total) by mouth 4 (four) times daily. 11/04/18   Phineas Semen, MD  zolpidem (AMBIEN) 5 MG tablet Take 5 mg by mouth at bedtime as needed for sleep.    [provider]    Family History Family History  Problem Relation Age of Onset   Diabetes Father    Heart disease Father    Breast cancer Neg Hx     Social History Social History   Tobacco Use   Smoking status:  Former    Packs/day: 0.50    Years: 18.00    Total pack years: 9.00    Types: Cigarettes   Smokeless tobacco: Never  Vaping Use   Vaping Use: Never used  Substance Use Topics   Alcohol use: No   Drug use: No     Allergies   Zofran [ondansetron hcl], Codeine, Neomycin, and Vicodin [hydrocodone-acetaminophen]   Review of Systems Review of Systems  Constitutional:  Negative for fever.  HENT:  Positive for ear pain and tinnitus. Negative for congestion, hearing loss, rhinorrhea and sore throat.   Eyes:  Negative for visual disturbance.  Neurological:  Positive for dizziness and headaches. Negative for syncope, facial asymmetry, weakness and numbness.  Hematological: Negative.   Psychiatric/Behavioral: Negative.       Physical Exam Triage Vital Signs ED Triage Vitals  Enc Vitals Group     BP 11/24/21 1736 137/77     Pulse Rate 11/24/21 1736 80     Resp --      Temp 11/24/21 1736 98.1 F (36.7 C)     Temp Source 11/24/21 1736 Oral     SpO2 11/24/21 1736 100 %     Weight 11/24/21 1734 210 lb (95.3 kg)     Height 11/24/21 1734 5\' 6"  (1.676 m)     Head Circumference --      Peak Flow --      Pain Score 11/24/21 1733 2     Pain Loc --      Pain Edu? --      Excl. in GC? --    No data found.  Updated Vital Signs BP 137/77 (BP Location: Left Arm)   Pulse 80   Temp 98.1 F (36.7 C) (Oral)   Ht 5\' 6"  (1.676 m)   Wt 210 lb (95.3 kg)   SpO2 100%   BMI 33.89 kg/m   Visual Acuity Right Eye Distance:   Left Eye Distance:   Bilateral Distance:    Right Eye Near:   Left Eye Near:    Bilateral Near:     Physical Exam Vitals and nursing note reviewed.  Constitutional:      Appearance: Normal appearance. She is not ill-appearing.  HENT:     Head: Normocephalic and atraumatic.     Right Ear: Tympanic membrane, ear canal and external ear normal. There is no impacted cerumen.  Left Ear: Tympanic membrane, ear canal and external ear normal. There is no impacted  cerumen.     Nose: Nose normal. No congestion or rhinorrhea.     Mouth/Throat:     Mouth: Mucous membranes are moist.     Pharynx: Oropharynx is clear. Posterior oropharyngeal erythema present. No oropharyngeal exudate.  Eyes:     General: No scleral icterus.    Extraocular Movements: Extraocular movements intact.     Conjunctiva/sclera: Conjunctivae normal.     Pupils: Pupils are equal, round, and reactive to light.  Cardiovascular:     Rate and Rhythm: Normal rate and regular rhythm.     Pulses: Normal pulses.     Heart sounds: Normal heart sounds. No murmur heard.    No friction rub. No gallop.  Pulmonary:     Effort: Pulmonary effort is normal.     Breath sounds: Normal breath sounds. No wheezing, rhonchi or rales.  Musculoskeletal:     Cervical back: Normal range of motion and neck supple.  Lymphadenopathy:     Cervical: No cervical adenopathy.  Skin:    General: Skin is warm and dry.     Capillary Refill: Capillary refill takes less than 2 seconds.     Findings: No erythema or rash.  Neurological:     General: No focal deficit present.     Mental Status: She is alert and oriented to person, place, and time.     Sensory: No sensory deficit.     Motor: No weakness.     Gait: Gait normal.     Deep Tendon Reflexes: Reflexes normal.  Psychiatric:        Mood and Affect: Mood normal.        Behavior: Behavior normal.        Thought Content: Thought content normal.        Judgment: Judgment normal.      UC Treatments / Results  Labs (all labs ordered are listed, but only abnormal results are displayed) Labs Reviewed - No data to display  EKG   Radiology No results found.  Procedures Procedures (including critical care time)  Medications Ordered in UC Medications - No data to display  Initial Impression / Assessment and Plan / UC Course  I have reviewed the triage vital signs and the nursing notes.  Pertinent labs & imaging results that were available  during my care of the patient were reviewed by me and considered in my medical decision making (see chart for details).  Patient is a very pleasant, nontoxic-appearing 51 year old female here for evaluation of 3 days worth of bilateral ear fullness, sound sensitivity, intermittent headache when she bends over, and ringing in her ears.  She does have a history of vertigo.  She denies any other upper respiratory symptoms.  Physical exam reveals cranial nerves II through XII intact.  Pupils are equal and reactive and EOMs intact.  Tympanic membrane's are pearly gray bilaterally with normal light reflex and clear external auditory canals.  No tenderness with palpation of the eustachian tubes externally.  Nasal mucosa is pink and moist without erythema, edema, or discharge.  Oropharyngeal exam is benign.  No cervical adenopathy appreciable exam.  Cardiopulmonary exam reveals S1-S2 heart sounds with regular rate and rhythm and lung sounds that are clear to auscultation in all fields.  Bilateral grips and upper extremity strength are 5/5 in bilateral lower extremity strength are 5/5.  DTRs are 2+ globally.  I suspect that patient's symptoms are coming from  exacerbation of her vertigo.  The fact that she gets a headache and dizziness when she lays forward and improves when she stands up is minimally that she has a vestibular balance.  She may possibly have any vestibular migraine.  She was able to transfer from the chair to the exam table and ambulate without difficulty.  There is no ataxia in her gait.  I will have her increase her Antivert to 12.5 mg during the day and 25 mg at night.  Also increase oral fluid intake.  I do want her to follow-up with her ENT and vertigo specialist.  If she has an increase in headache, dizziness, mental status changes she should go to the ER for evaluation.   Final Clinical Impressions(s) / UC Diagnoses   Final diagnoses:  Benign paroxysmal positional vertigo, unspecified  laterality     Discharge Instructions      Use the Meclizine 12.5 mg during the day and 25 mg at night as needed for dizziness.  Use OTC Tylenol and Ibuprofen as needed for headache.  Schedule an appointment with your ENT/Vertigo specialist next week.  If you develop a worsening headache, changes in mental status, or dizziness you need to go to the ER.      ED Prescriptions     Medication Sig Dispense Auth. Provider   meclizine (ANTIVERT) 12.5 MG tablet Take 1 tablet (12.5 mg total) by mouth 3 (three) times daily as needed for dizziness. 30 tablet Becky Augusta, NP      PDMP not reviewed this encounter.   Becky Augusta, NP 11/24/21 1811

## 2021-12-20 ENCOUNTER — Other Ambulatory Visit: Payer: Self-pay

## 2021-12-20 DIAGNOSIS — N6313 Unspecified lump in the right breast, lower outer quadrant: Secondary | ICD-10-CM

## 2022-01-02 ENCOUNTER — Encounter: Payer: Self-pay | Admitting: *Deleted

## 2022-01-02 ENCOUNTER — Other Ambulatory Visit: Payer: Self-pay

## 2022-01-02 ENCOUNTER — Ambulatory Visit: Payer: Self-pay | Attending: Hematology and Oncology | Admitting: *Deleted

## 2022-01-02 VITALS — BP 148/86 | Wt 213.2 lb

## 2022-01-02 DIAGNOSIS — N6313 Unspecified lump in the right breast, lower outer quadrant: Secondary | ICD-10-CM

## 2022-01-02 DIAGNOSIS — Z1211 Encounter for screening for malignant neoplasm of colon: Secondary | ICD-10-CM

## 2022-01-02 NOTE — Progress Notes (Signed)
[name] is a 51 y.o. G2P2 female who presents to [facility] clinic today with complaints of finding a new right breast mass. She states she found the lump about 2 weeks ago.  Unsure if the area is unchanged since she has not wanted to palpate it anymore. Patient presents via referral from [facility] for further evaluation of a right breast mass.  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     Pap Smear was not completed today.  Last pap was in 2015.  Patient did not want to complete her pap with [facility] at her 2020 visit.  She refused a pap today.  Encouraged patient to call and schedule a pap smear.  States she will call to schedule an appointment.  Last pap result is not available in Epic.   Physical exam: Breasts Physical Exam Chest:  Breasts:    Right: Mass present. No swelling, bleeding, inverted nipple, nipple discharge, skin change or tenderness.     Left: Mass present. No swelling, bleeding, inverted nipple, nipple discharge, skin change or tenderness.        Smoking History: Patient has is a former smoker.    Patient Navigation: Patient education provided. Access to services provided for patient through [facility] program. No interpreter provided. No transportation provided   Colorectal Cancer Screening: Per patient has never had colonoscopy completed. No complaints today.   FIT test given with instructions.   Breast and Cervical Cancer Risk Assessment: Patient has family history of breast cancer in her maternal great grandmother, no known genetic mutations, or radiation treatment to the chest before age 36. Patient does not have history of cervical dysplasia, immunocompromised, or DES exposure in-utero.  Risk Assessment   No risk assessment data for the current encounter  Risk Scores       09/29/2018   Last edited by: Gabriella Presto, RN   5-year risk: 1.4 %   Lifetime risk: 12.3 %           Risk Assessment     Risk Scores        01/02/2022 09/29/2018   Last edited by: Gabriella Rutherford, LPN Gabriella Presto, RN   5-year risk: 1.3 % 1.4 %   Lifetime risk: 11.4 % 12.3 %            A: [facility] exam without pap smear Complaint of new right breast mass  P: Referred patient to the [facility]  for a diagnostic mammogram. Appointment scheduled 01/05/22 @ 9:40.  Gabriella Like, RN 01/02/2022 12:30 PM

## 2022-01-02 NOTE — Progress Notes (Deleted)
  Subjective:     Patient ID: Gabriella Chambers, female   DOB: 1970-06-28, 51 y.o.   MRN: [id number]  HPI   Review of Systems     Objective:   Physical Exam Chest:  Breasts:    Right: Mass present. No swelling, bleeding, inverted nipple, nipple discharge, skin change or tenderness.     Left: Mass present. No swelling, bleeding, inverted nipple, nipple discharge, skin change or tenderness.    Lymphadenopathy:     Upper Body:     Right upper body: No supraclavicular or axillary adenopathy.     Left upper body: No supraclavicular or axillary adenopathy.      Assessment:     ***    Plan:     ***

## 2022-01-05 ENCOUNTER — Ambulatory Visit
Admission: RE | Admit: 2022-01-05 | Discharge: 2022-01-05 | Disposition: A | Discharge status: Home / Self Care | Payer: Self-pay | Source: Ambulatory Visit | Attending: Obstetrics and Gynecology | Admitting: Obstetrics and Gynecology

## 2022-01-05 DIAGNOSIS — N6313 Unspecified lump in the right breast, lower outer quadrant: Secondary | ICD-10-CM

## 2022-03-11 ENCOUNTER — Ambulatory Visit
Admission: EM | Admit: 2022-03-11 | Discharge: 2022-03-11 | Disposition: A | Discharge status: Home / Self Care | Payer: Self-pay | Attending: Physician Assistant | Admitting: Physician Assistant

## 2022-03-11 ENCOUNTER — Encounter: Payer: Self-pay | Admitting: Emergency Medicine

## 2022-03-11 DIAGNOSIS — J019 Acute sinusitis, unspecified: Secondary | ICD-10-CM

## 2022-03-11 MED ORDER — AMOXICILLIN-POT CLAVULANATE 875-125 MG PO TABS: 1.0000 | ORAL_TABLET | Freq: Two times a day (BID) | ORAL | 0 refills | Status: AC

## 2022-03-11 NOTE — Discharge Instructions (Addendum)
-  Mucinex D, Flonase, nasal saline, Augmentin. Should be feeling better in the next 1 week. Return if new/worsening symptoms.

## 2022-03-11 NOTE — ED Provider Notes (Signed)
[facility]    CSN: [id number] Arrival date & time: 03/11/22  1226      History   Chief Complaint Chief Complaint  Patient presents with   Nasal Congestion    HPI Gabriella Chambers is a 51 y.o. female 1 week history of presenting for nasal congestion and cough.  She says that she felt better yesterday but today she started to have a lot of pain of the left maxillary sinus and pain into her teeth.  Increased pain when she leans forward and increased swelling of her left nostril.  Denies fever or breathing difficulty.  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  She says she thought that she would get better but she has only gotten worse.  HPI  Past Medical History:  Diagnosis Date   Cholelithiasis    Depression    GERD (gastroesophageal reflux disease)    Heart palpitations    Hypertension    Muscle spasm    PTSD (post-traumatic stress disorder)    Thyroid disease    Hypothyroidism   Vertigo     Patient Active Problem List   Diagnosis Date Noted   Gallstones 11/02/2013    Past Surgical History:  Procedure Laterality Date   BREAST BIOPSY Left 2006   neg- done in Dr. Purvis [place] office   BREAST LUMPECTOMY  2006   CESAREAN SECTION  00,2006   TUBAL LIGATION  2006    OB History     Gravida  2   Para  2   Term      Preterm      AB      Living  2      SAB      IAB      Ectopic      Multiple      Live Births           Obstetric Comments  1st Menstrual Cycle:  14 1st Pregnancy:  28          Home Medications    Prior to Admission medications   Medication Sig Start Date End Date Taking? Authorizing Provider  amLODipine (NORVASC) 5 MG tablet Take 5 mg by mouth daily. 11/15/21  Yes [provider]  amoxicillin-clavulanate (AUGMENTIN) 875-125 MG tablet Take 1 tablet by mouth every 12 (twelve) hours for 7 days. 03/11/22 03/18/22 Yes Shirlee Latch, PA-C  levothyroxine (SYNTHROID, LEVOTHROID) 125 MCG  tablet Take 1 tablet by mouth daily.   Yes [provider]  pantoprazole (PROTONIX) 40 MG tablet Take 40 mg by mouth daily.   Yes [provider]  ranitidine (ZANTAC) 150 MG tablet Take 150 mg by mouth 2 (two) times daily.   Yes [provider]  acetaminophen (TYLENOL) 500 MG tablet Take 1,000 mg by mouth every 6 (six) hours as needed.    [provider]  benzonatate (TESSALON) 200 MG capsule Take 1 capsule (200 mg total) by mouth 3 (three) times daily as needed for cough. 07/02/17   Renford Dills, NP  busPIRone (BUSPAR) 7.5 MG tablet Take 7.5 mg by mouth 3 (three) times daily.    [provider]  butalbital-acetaminophen-caffeine (FIORICET WITH CODEINE) 50-325-40-30 MG capsule Take 1-2 capsules by mouth every 4 (four) hours as needed for headache.    [provider]  hydrOXYzine (ATARAX/VISTARIL) 50 MG tablet Take 25-50 mg by mouth as needed.    [provider]  ipratropium (ATROVENT) 0.06 % nasal spray Place 2 sprays into both  nostrils 4 (four) times daily. 05/27/21   Becky Augusta, NP  lidocaine (XYLOCAINE) 2 % solution Use as directed 15 mLs in the mouth or throat as needed for mouth pain. 05/27/21   Becky Augusta, NP  meclizine (ANTIVERT) 12.5 MG tablet Take 1 tablet (12.5 mg total) by mouth 3 (three) times daily as needed for dizziness. 11/24/21   Becky Augusta, NP  sucralfate (CARAFATE) 1 g tablet Take 1 tablet (1 g total) by mouth 2 (two) times daily for 14 days. 01/17/18 01/31/18  Willy Eddy, MD  sucralfate (CARAFATE) 1 g tablet Take 1 tablet (1 g total) by mouth 4 (four) times daily. 11/04/18   Phineas Semen, MD  zolpidem (AMBIEN) 5 MG tablet Take 5 mg by mouth at bedtime as needed for sleep.    [provider]    Family History Family History  Problem Relation Age of Onset   Diabetes Father    Heart disease Father    Breast cancer Neg Hx     Social History Social History   Tobacco Use   Smoking status:  Former    Packs/day: 0.50    Years: 18.00    Total pack years: 9.00    Types: Cigarettes   Smokeless tobacco: Never  Vaping Use   Vaping Use: Never used  Substance Use Topics   Alcohol use: No   Drug use: No     Allergies   Zofran [ondansetron hcl], Codeine, Neomycin, and Vicodin [hydrocodone-acetaminophen]   Review of Systems Review of Systems  Constitutional:  Negative for chills, diaphoresis, fatigue and fever.  HENT:  Positive for congestion, rhinorrhea, sinus pressure and sinus pain. Negative for ear pain and sore throat.   Respiratory:  Positive for cough. Negative for shortness of breath.   Gastrointestinal:  Negative for abdominal pain, nausea and vomiting.  Musculoskeletal:  Negative for arthralgias and myalgias.  Skin:  Negative for rash.  Neurological:  Negative for weakness and headaches.  Hematological:  Negative for adenopathy.     Physical Exam Triage Vital Signs ED Triage Vitals  Enc Vitals Group     BP 03/11/22 1300 130/84     Pulse Rate 03/11/22 1300 74     Resp 03/11/22 1300 16     Temp 03/11/22 1300 98.5 F (36.9 C)     Temp Source 03/11/22 1300 Oral     SpO2 03/11/22 1300 100 %     Weight 03/11/22 1258 213 lb 3 oz (96.7 kg)     Height 03/11/22 1258 5\' 6"  (1.676 m)     Head Circumference --      Peak Flow --      Pain Score 03/11/22 1257 6     Pain Loc --      Pain Edu? --      Excl. in GC? --    No data found.  Updated Vital Signs BP 130/84 (BP Location: Right Arm)   Pulse 74   Temp 98.5 F (36.9 C) (Oral)   Resp 16   Ht 5\' 6"  (1.676 m)   Wt 213 lb 3 oz (96.7 kg)   SpO2 100%   BMI 34.41 kg/m     Physical Exam Vitals and nursing note reviewed.  Constitutional:      General: She is not in acute distress.    Appearance: Normal appearance. She is not ill-appearing or toxic-appearing.  HENT:     Head: Normocephalic and atraumatic.     Right Ear: Tympanic membrane, ear canal and external  ear normal.     Left Ear: Tympanic  membrane, ear canal and external ear normal.     Nose: Congestion present.     Left Sinus: Maxillary sinus tenderness present.     Mouth/Throat:     Mouth: Mucous membranes are moist.     Pharynx: Oropharynx is clear.  Eyes:     General: No scleral icterus.       Right eye: No discharge.        Left eye: No discharge.     Conjunctiva/sclera: Conjunctivae normal.  Cardiovascular:     Rate and Rhythm: Normal rate and regular rhythm.     Heart sounds: Normal heart sounds.  Pulmonary:     Effort: Pulmonary effort is normal. No respiratory distress.     Breath sounds: Normal breath sounds.  Musculoskeletal:     Cervical back: Neck supple.  Skin:    General: Skin is dry.  Neurological:     General: No focal deficit present.     Mental Status: She is alert. Mental status is at baseline.     Motor: No weakness.     Gait: Gait normal.  Psychiatric:        Mood and Affect: Mood normal.        Behavior: Behavior normal.        Thought Content: Thought content normal.      UC Treatments / Results  Labs (all labs ordered are listed, but only abnormal results are displayed) Labs Reviewed - No data to display  EKG   Radiology No results found.  Procedures Procedures (including critical care time)  Medications Ordered in UC Medications - No data to display  Initial Impression / Assessment and Plan / UC Course  I have reviewed the triage vital signs and the nursing notes.  Pertinent labs & imaging results that were available during my care of the patient were reviewed by me and considered in my medical decision making (see chart for details).   51 year old female presents for 1 week history of cough and congestion. 1 day history of left sinus pain and pressure.  Vitals normal and stable.  Patient overall well-appearing.  On exam she has nasal congestion and increased swelling in her left nasal mucosal.  Tenderness palpation of the left maxillary sinus.  Chest clear auscultation  heart regular rate and rhythm.  Suspect secondary acute bacterial sinusitis.  Treating with Augmentin.  Supportive care.  Follow-up as needed.   Final Clinical Impressions(s) / UC Diagnoses   Final diagnoses:  Acute sinusitis, recurrence not specified, unspecified location     Discharge Instructions      -Mucinex D, Flonase, nasal saline, Augmentin. Should be feeling better in the next 1 week. Return if new/worsening symptoms.      ED Prescriptions     Medication Sig Dispense Auth. Provider   amoxicillin-clavulanate (AUGMENTIN) 875-125 MG tablet Take 1 tablet by mouth every 12 (twelve) hours for 7 days. 14 tablet Gareth Morgan      PDMP not reviewed this encounter.   Shirlee Latch, PA-C 03/11/22 1337

## 2022-03-11 NOTE — ED Triage Notes (Signed)
Pt c/o nasal congestion, sinus pain/pressure. Started about a week ago. 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 Home covid test negative.

## 2022-03-30 ENCOUNTER — Encounter: Payer: Self-pay | Admitting: Emergency Medicine

## 2022-03-30 ENCOUNTER — Ambulatory Visit
Admission: EM | Admit: 2022-03-30 | Discharge: 2022-03-30 | Disposition: A | Discharge status: Home / Self Care | Payer: Self-pay | Attending: Family Medicine | Admitting: Family Medicine

## 2022-03-30 DIAGNOSIS — Z20822 Contact with and (suspected) exposure to covid-19: Secondary | ICD-10-CM | POA: Insufficient documentation

## 2022-03-30 DIAGNOSIS — Z87891 Personal history of nicotine dependence: Secondary | ICD-10-CM | POA: Insufficient documentation

## 2022-03-30 DIAGNOSIS — J101 Influenza due to other identified influenza virus with other respiratory manifestations: Secondary | ICD-10-CM

## 2022-03-30 LAB — RESP PANEL BY RT-PCR (RSV, FLU A&B, COVID)  RVPGX2
Influenza A by PCR: POSITIVE — AB
Influenza B by PCR: NEGATIVE
Resp Syncytial Virus by PCR: NEGATIVE
SARS Coronavirus 2 by RT PCR: NEGATIVE

## 2022-03-30 MED ORDER — OSELTAMIVIR PHOSPHATE 75 MG PO CAPS: 75.0000 mg | ORAL_CAPSULE | Freq: Two times a day (BID) | ORAL | 0 refills | Status: DC

## 2022-03-30 NOTE — ED Provider Notes (Addendum)
[facility]    CSN: [id number] Arrival date & time: 03/30/22  1315      History   Chief Complaint Chief Complaint  Patient presents with   Nasal Congestion   Cough    HPI Gabriella Chambers is a 51 y.o. female.    Cough   Presents urgent care with complaint of cough, runny nose, fever, chills, myalgias nasal congestion starting this morning.  Patient works in [employer].  Requesting test for flu, COVID, RSV because of risk of transferring illness to elderly parent at home.  She denies cough.  Denies sneezing.  Denies sinus pressure.  Denies nausea, vomiting, diarrhea.  Denies sore throat.  Past Medical History:  Diagnosis Date   Cholelithiasis    Depression    GERD (gastroesophageal reflux disease)    Heart palpitations    Hypertension    Muscle spasm    PTSD (post-traumatic stress disorder)    Thyroid disease    Hypothyroidism   Vertigo     Patient Active Problem List   Diagnosis Date Noted   Gallstones 11/02/2013    Past Surgical History:  Procedure Laterality Date   BREAST BIOPSY Left 2006   neg- done in Dr. Purvis [place] office   BREAST LUMPECTOMY  2006   CESAREAN SECTION  00,2006   TUBAL LIGATION  2006    OB History     Gravida  2   Para  2   Term      Preterm      AB      Living  2      SAB      IAB      Ectopic      Multiple      Live Births           Obstetric Comments  1st Menstrual Cycle:  14 1st Pregnancy:  28          Home Medications    Prior to Admission medications   Medication Sig Start Date End Date Taking? Authorizing Provider  amLODipine (NORVASC) 5 MG tablet Take 5 mg by mouth daily. 11/15/21  Yes [provider]  levothyroxine (SYNTHROID, LEVOTHROID) 125 MCG tablet Take 1 tablet by mouth daily.   Yes [provider]  pantoprazole (PROTONIX) 40 MG tablet Take 40 mg by mouth daily.   Yes [provider]  acetaminophen (TYLENOL) 500 MG tablet Take 1,000 mg by mouth every 6  (six) hours as needed.    [provider]  benzonatate (TESSALON) 200 MG capsule Take 1 capsule (200 mg total) by mouth 3 (three) times daily as needed for cough. 07/02/17   Renford Dills, NP  busPIRone (BUSPAR) 7.5 MG tablet Take 7.5 mg by mouth 3 (three) times daily.    [provider]  butalbital-acetaminophen-caffeine (FIORICET WITH CODEINE) 50-325-40-30 MG capsule Take 1-2 capsules by mouth every 4 (four) hours as needed for headache.    [provider]  hydrOXYzine (ATARAX/VISTARIL) 50 MG tablet Take 25-50 mg by mouth as needed.    [provider]  ipratropium (ATROVENT) 0.06 % nasal spray Place 2 sprays into both nostrils 4 (four) times daily. 05/27/21   Becky Augusta, NP  lidocaine (XYLOCAINE) 2 % solution Use as directed 15 mLs in the mouth or throat as needed for mouth pain. 05/27/21   Becky Augusta, NP  meclizine (ANTIVERT) 12.5 MG tablet Take 1 tablet (12.5 mg total) by mouth 3 (three) times daily as needed for dizziness. 11/24/21  Becky Augusta, NP  ranitidine (ZANTAC) 150 MG tablet Take 150 mg by mouth 2 (two) times daily.    [provider]  sucralfate (CARAFATE) 1 g tablet Take 1 tablet (1 g total) by mouth 2 (two) times daily for 14 days. 01/17/18 01/31/18  Willy Eddy, MD  sucralfate (CARAFATE) 1 g tablet Take 1 tablet (1 g total) by mouth 4 (four) times daily. 11/04/18   Phineas Semen, MD  zolpidem (AMBIEN) 5 MG tablet Take 5 mg by mouth at bedtime as needed for sleep.    [provider]    Family History Family History  Problem Relation Age of Onset   Diabetes Father    Heart disease Father    Breast cancer Neg Hx     Social History Social History   Tobacco Use   Smoking status: Former    Packs/day: 0.50    Years: 18.00    Total pack years: 9.00    Types: Cigarettes   Smokeless tobacco: Never  Vaping Use   Vaping Use: Never used  Substance Use Topics   Alcohol use: No   Drug use: No     Allergies    Zofran [ondansetron hcl], Codeine, Neomycin, and Vicodin [hydrocodone-acetaminophen]   Review of Systems Review of Systems  Respiratory:  Positive for cough.      Physical Exam Triage Vital Signs ED Triage Vitals  Enc Vitals Group     BP 03/30/22 1421 (!) 155/97     Pulse Rate 03/30/22 1421 88     Resp 03/30/22 1421 14     Temp 03/30/22 1421 99 F (37.2 C)     Temp Source 03/30/22 1421 Oral     SpO2 03/30/22 1421 97 %     Weight 03/30/22 1418 217 lb 9.5 oz (98.7 kg)     Height 03/30/22 1418 5\' 6"  (1.676 m)     Head Circumference --      Peak Flow --      Pain Score 03/30/22 1418 3     Pain Loc --      Pain Edu? --      Excl. in [place]? --    No data found.  Updated Vital Signs BP (!) 155/97 (BP Location: Right Arm)   Pulse 88   Temp 99 F (37.2 C) (Oral)   Resp 14   Ht 5\' 6"  (1.676 m)   Wt 217 lb 9.5 oz (98.7 kg)   SpO2 97%   BMI 35.12 kg/m   Visual Acuity Right Eye Distance:   Left Eye Distance:   Bilateral Distance:    Right Eye Near:   Left Eye Near:    Bilateral Near:     Physical Exam Vitals reviewed.  Constitutional:      Appearance: Normal appearance.  HENT:     Mouth/Throat:     Pharynx: No oropharyngeal exudate or posterior oropharyngeal erythema.  Cardiovascular:     Rate and Rhythm: Normal rate and regular rhythm.     Pulses: Normal pulses.     Heart sounds: Normal heart sounds.  Pulmonary:     Effort: Pulmonary effort is normal.     Breath sounds: Normal breath sounds.  Skin:    General: Skin is warm and dry.  Neurological:     General: No focal deficit present.     Mental Status: She is alert and oriented to person, place, and time.  Psychiatric:        Mood and Affect: Mood normal.  Behavior: Behavior normal.      UC Treatments / Results  Labs (all labs ordered are listed, but only abnormal results are displayed) Labs Reviewed  RESP PANEL BY RT-PCR (RSV, FLU A&B, COVID)  [id number]    EKG   Radiology No results  found.  Procedures Procedures (including critical care time)  Medications Ordered in UC Medications - No data to display  Initial Impression / Assessment and Plan / UC Course  I have reviewed the triage vital signs and the nursing notes.  Pertinent labs & imaging results that were available during my care of the patient were reviewed by me and considered in my medical decision making (see chart for details).   Patient is afebrile here without recent antipyretics. Satting well on room air. Overall is ill appearing, though well hydrated, without respiratory distress. Pulmonary exam is unremarkable.  Lungs CTAB.  No pharyngeal erythema and no peritonsillar exudates.  Suspect Viral pathology including influenza.  Results of respiratory swab are pending and patient chooses to wait in clinic.  Positive for flu A. Tamiflu prescribed.  Final Clinical Impressions(s) / UC Diagnoses   Final diagnoses:  None   Discharge Instructions   None    ED Prescriptions   None    PDMP not reviewed this encounter.   Charma Igo, FNP 03/30/22 1511    ImmordinoJeannett Senior, FNP 03/30/22 1514

## 2022-03-30 NOTE — Discharge Instructions (Signed)
Follow up here or with your primary care provider if your symptoms are worsening or not improving.     

## 2022-03-30 NOTE — ED Triage Notes (Signed)
Patient c/o cough, runny nose, low grade fevers, nasal congestion that started this morning.  Patient works at a daycare.  Patient would like to be tested for flu, covid and RSV

## 2022-06-22 ENCOUNTER — Ambulatory Visit
Admission: EM | Admit: 2022-06-22 | Discharge: 2022-06-22 | Disposition: A | Discharge status: Home / Self Care | Payer: Self-pay | Attending: Family Medicine | Admitting: Family Medicine

## 2022-06-22 DIAGNOSIS — J029 Acute pharyngitis, unspecified: Secondary | ICD-10-CM

## 2022-06-22 MED ORDER — LIDOCAINE VISCOUS HCL 2 % MT SOLN: 15.0000 mL | OROMUCOSAL | 0 refills | Status: AC | PRN

## 2022-06-22 NOTE — ED Provider Notes (Signed)
[facility]    CSN: [id number] Arrival date & time: 06/22/22  1723      History   Chief Complaint Chief Complaint  Patient presents with   Sore Throat    HPI Gabriella Chambers is a 52 y.o. female.   HPI   Gabriella Chambers presents for sore throat that started Wednesday morning. Pain in throat that is worse with sore throat.  Her throat gets really dry.  She can't take Tylenol as her liver enzymes are elevated.  Taking Motrin helps but stops about 6 hours in.    Fever : no  Chills: no Sore throat: yes  Cough: slight Sputum: no Nasal congestion : no  Rhinorrhea: no Myalgias: no Appetite: decreased Hydration: normal  Abdominal pain: no Nausea: no Vomiting: no Diarrhea: No Rash: No Sleep disturbance: yes Headache: no      Past Medical History:  Diagnosis Date   Cholelithiasis    Depression    GERD (gastroesophageal reflux disease)    Heart palpitations    Hypertension    Muscle spasm    PTSD (post-traumatic stress disorder)    Thyroid disease    Hypothyroidism   Vertigo     Patient Active Problem List   Diagnosis Date Noted   Gallstones 11/02/2013    Past Surgical History:  Procedure Laterality Date   BREAST BIOPSY Left 2006   neg- done in Dr. Festus Aloe office   BREAST LUMPECTOMY  2006   CESAREAN SECTION  00,2006   TUBAL LIGATION  2006    OB History     Gravida  2   Para  2   Term      Preterm      AB      Living  2      SAB      IAB      Ectopic      Multiple      Live Births           Obstetric Comments  1st Menstrual Cycle:  14 1st Pregnancy:  28          Home Medications    Prior to Admission medications   Medication Sig Start Date End Date Taking? Authorizing Provider  amLODipine (NORVASC) 5 MG tablet Take 5 mg by mouth daily. 11/15/21  Yes [provider]  benzonatate (TESSALON) 200 MG capsule Take 1 capsule (200 mg total) by mouth 3 (three) times daily as needed for cough. 07/02/17  Yes  Gabriella Land, NP  busPIRone (BUSPAR) 7.5 MG tablet Take 7.5 mg by mouth 3 (three) times daily.   Yes [provider]  butalbital-acetaminophen-caffeine (FIORICET WITH CODEINE) 50-325-40-30 MG capsule Take 1-2 capsules by mouth every 4 (four) hours as needed for headache.   Yes [provider]  hydrOXYzine (ATARAX/VISTARIL) 50 MG tablet Take 25-50 mg by mouth as needed.   Yes [provider]  ipratropium (ATROVENT) 0.06 % nasal spray Place 2 sprays into both nostrils 4 (four) times daily. 05/27/21  Yes Margarette Canada, NP  levothyroxine (SYNTHROID, LEVOTHROID) 125 MCG tablet Take 1 tablet by mouth daily.   Yes [provider]  meclizine (ANTIVERT) 12.5 MG tablet Take 1 tablet (12.5 mg total) by mouth 3 (three) times daily as needed for dizziness. 11/24/21  Yes Margarette Canada, NP  oseltamivir (TAMIFLU) 75 MG capsule Take 1 capsule (75 mg total) by mouth every 12 (twelve) hours. 03/30/22  Yes Immordino, Annie Main, FNP  pantoprazole (PROTONIX) 40 MG tablet Take 40 mg  by mouth daily.   Yes [provider]  ranitidine (ZANTAC) 150 MG tablet Take 150 mg by mouth 2 (two) times daily.   Yes [provider]  sucralfate (CARAFATE) 1 g tablet Take 1 tablet (1 g total) by mouth 4 (four) times daily. 11/04/18  Yes Nance Pear, MD  zolpidem (AMBIEN) 5 MG tablet Take 5 mg by mouth at bedtime as needed for sleep.   Yes [provider]  lidocaine (XYLOCAINE) 2 % solution Use as directed 15 mLs in the mouth or throat as needed for mouth pain. 06/22/22   Gabriella Chambers, Ronnette Juniper, DO  sucralfate (CARAFATE) 1 g tablet Take 1 tablet (1 g total) by mouth 2 (two) times daily for 14 days. 01/17/18 01/31/18  Merlyn Lot, MD    Family History Family History  Problem Relation Age of Onset   Diabetes Father    Heart disease Father    Breast cancer Neg Hx     Social History Social History   Tobacco Use   Smoking status: Former    Packs/day: 0.50    Years: 18.00     Total pack years: 9.00    Types: Cigarettes   Smokeless tobacco: Never  Vaping Use   Vaping Use: Never used  Substance Use Topics   Alcohol use: No   Drug use: No     Allergies   Zofran [ondansetron hcl], Codeine, Neomycin, and Vicodin [hydrocodone-acetaminophen]   Review of Systems Review of Systems: negative unless otherwise stated in HPI.      Physical Exam Triage Vital Signs ED Triage Vitals  Enc Vitals Group     BP      Pulse      Resp      Temp      Temp src      SpO2      Weight      Height      Head Circumference      Peak Flow      Pain Score      Pain Loc      Pain Edu?      Excl. in [place]?    No data found.  Updated Vital Signs BP (!) 156/91 (BP Location: Left Arm)   Pulse 89   Temp 98.3 F (36.8 C) (Oral)   Resp 16   Ht '5\' 6"'$  (1.676 m)   Wt 96.6 kg   SpO2 96%   BMI 34.38 kg/m   Visual Acuity Right Eye Distance:   Left Eye Distance:   Bilateral Distance:    Right Eye Near:   Left Eye Near:    Bilateral Near:     Physical Exam GEN:     alert, non-toxic appearing female in no distress    HENT:  mucus membranes moist, oropharyngeal without lesions or exudate, no tonsillar hypertrophy, moderate oropharyngeal erythema, clear nasal discharge, bilateral TM normal EYES:   pupils equal and reactive, no scleral injection or discharge NECK:  normal ROM, anterior lymphadenopathy RESP:  no increased work of breathing CVS:   regular rate  Skin:   warm and dry    UC Treatments / Results  Labs (all labs ordered are listed, but only abnormal results are displayed) Labs Reviewed - No data to display   EKG   Radiology No results found.  Procedures Procedures (including critical care time)  Medications Ordered in UC Medications - No data to display  Initial Impression / Assessment and Plan / UC Course  I have reviewed  the triage vital signs and the nursing notes.  Pertinent labs & imaging results that were available during my care of  the patient were reviewed by me and considered in my medical decision making (see chart for details).       Pt is a 52 y.o. female who presents for 2-3 days of respiratory symptoms. Gabriella Chambers is afebrile here without recent antipyretics. Satting well on room air. Overall pt is non-toxic appearing, well hydrated, without respiratory distress. 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  History consistent with viral pharyngitis. Discussed symptomatic treatment.  Explained lack of efficacy of antibiotics in viral disease.  Typical duration of symptoms discussed.   Return and ED precautions given and voiced understanding. Discussed MDM, treatment plan and plan for follow-up with patient who agrees with plan.     Final Clinical Impressions(s) / UC Diagnoses   Final diagnoses:  Acute pharyngitis, unspecified etiology     Discharge Instructions      Stop by the pharmacy to pick up your prescriptions. You can take Ibuprofen as needed for fever reduction and pain relief every 6-8 hours.     For sore throat: try warm salt water gargles, Mucinex sore throat cough drops or cepacol lozenges, throat spray, warm tea or water with lemon/honey, popsicles or ice, or OTC cold relief medicine for throat discomfort. You can also purchase chloraseptic spray at the pharmacy or dollar store.   It is important to stay hydrated: drink plenty of fluids (water, gatorade/powerade/pedialyte, juices, or teas) to keep your throat moisturized and help further relieve irritation/discomfort.    Return or go to the Emergency Department if symptoms worsen or do not improve in the next few days      ED Prescriptions     Medication Sig Dispense Auth. Provider   lidocaine (XYLOCAINE) 2 % solution Use as directed 15 mLs in the mouth or throat as needed for mouth pain. 100 mL Lyndee Hensen, DO      PDMP not reviewed this encounter.   Lyndee Hensen, DO 06/22/22 1752

## 2022-06-22 NOTE — Discharge Instructions (Signed)
Stop by the pharmacy to pick up your prescriptions. You can take Ibuprofen as needed for fever reduction and pain relief every 6-8 hours.     For sore throat: try warm salt water gargles, Mucinex sore throat cough drops or cepacol lozenges, throat spray, warm tea or water with lemon/honey, popsicles or ice, or OTC cold relief medicine for throat discomfort. You can also purchase chloraseptic spray at the pharmacy or dollar store.   It is important to stay hydrated: drink plenty of fluids (water, gatorade/powerade/pedialyte, juices, or teas) to keep your throat moisturized and help further relieve irritation/discomfort.    Return or go to the Emergency Department if symptoms worsen or do not improve in the next few days

## 2022-06-22 NOTE — ED Triage Notes (Signed)
Pt c/o sore throat x2 days 

## 2022-11-30 ENCOUNTER — Ambulatory Visit
Admission: EM | Admit: 2022-11-30 | Discharge: 2022-11-30 | Disposition: A | Discharge status: Home / Self Care | Payer: Self-pay | Attending: Physician Assistant | Admitting: Physician Assistant

## 2022-11-30 DIAGNOSIS — L739 Follicular disorder, unspecified: Secondary | ICD-10-CM

## 2022-11-30 MED ORDER — DOXYCYCLINE HYCLATE 100 MG PO CAPS: 100.0000 mg | ORAL_CAPSULE | Freq: Two times a day (BID) | ORAL | 0 refills | Status: AC

## 2022-11-30 MED ORDER — MUPIROCIN 2 % EX OINT: 1.0000 | TOPICAL_OINTMENT | Freq: Two times a day (BID) | CUTANEOUS | 0 refills | Status: AC

## 2022-11-30 NOTE — ED Provider Notes (Signed)
[facility]    CSN: [id number] Arrival date & time: 11/30/22  1703      History   Chief Complaint Chief Complaint  Patient presents with   Mass         HPI Gabriella Chambers is a 52 y.o. female presenting with husband for possible abscess of left buttocks for the past 5 days. No change in size. Husband has noted pustular drainage. She has cleaned the area. Thinks she could have ingrown hair. No fever or significant pain.  HPI  Past Medical History:  Diagnosis Date   Cholelithiasis    Depression    GERD (gastroesophageal reflux disease)    Heart palpitations    Hypertension    Muscle spasm    PTSD (post-traumatic stress disorder)    Thyroid disease    Hypothyroidism   Vertigo     Patient Active Problem List   Diagnosis Date Noted   Gallstones 11/02/2013    Past Surgical History:  Procedure Laterality Date   BREAST BIOPSY Left 2006   neg- done in Dr. Purvis [place] office   BREAST LUMPECTOMY  2006   CESAREAN SECTION  00,2006   TUBAL LIGATION  2006    OB History     Gravida  2   Para  2   Term      Preterm      AB      Living  2      SAB      IAB      Ectopic      Multiple      Live Births           Obstetric Comments  1st Menstrual Cycle:  14 1st Pregnancy:  28          Home Medications    Prior to Admission medications   Medication Sig Start Date End Date Taking? Authorizing Provider  amLODipine (NORVASC) 5 MG tablet Take 5 mg by mouth daily. 11/15/21  Yes [provider]  doxycycline (VIBRAMYCIN) 100 MG capsule Take 1 capsule (100 mg total) by mouth 2 (two) times daily for 7 days. 11/30/22 12/07/22 Yes Shirlee Latch, PA-C  levothyroxine (SYNTHROID, LEVOTHROID) 125 MCG tablet Take 1 tablet by mouth daily.   Yes [provider]  meclizine (ANTIVERT) 12.5 MG tablet Take 1 tablet (12.5 mg total) by mouth 3 (three) times daily as needed for dizziness. 11/24/21  Yes Becky Augusta, NP  mupirocin ointment  (BACTROBAN) 2 % Apply 1 Application topically 2 (two) times daily. 11/30/22  Yes Eusebio Friendly B, PA-C  pantoprazole (PROTONIX) 40 MG tablet Take 40 mg by mouth daily.   Yes [provider]  ranitidine (ZANTAC) 150 MG tablet Take 150 mg by mouth 2 (two) times daily.   Yes [provider]  benzonatate (TESSALON) 200 MG capsule Take 1 capsule (200 mg total) by mouth 3 (three) times daily as needed for cough. 07/02/17   Renford Dills, NP  busPIRone (BUSPAR) 7.5 MG tablet Take 7.5 mg by mouth 3 (three) times daily.    [provider]  butalbital-acetaminophen-caffeine (FIORICET WITH CODEINE) 50-325-40-30 MG capsule Take 1-2 capsules by mouth every 4 (four) hours as needed for headache.    [provider]  hydrOXYzine (ATARAX/VISTARIL) 50 MG tablet Take 25-50 mg by mouth as needed.    [provider]  ipratropium (ATROVENT) 0.06 % nasal spray Place 2 sprays into both nostrils 4 (four) times daily. 05/27/21   Becky Augusta, NP  lidocaine (XYLOCAINE) 2 % solution Use as directed 15 mLs in the mouth or throat as needed for mouth pain. 06/22/22   Katha Cabal, DO  oseltamivir (TAMIFLU) 75 MG capsule Take 1 capsule (75 mg total) by mouth every 12 (twelve) hours. 03/30/22   Immordino, Jeannett Senior, FNP  sucralfate (CARAFATE) 1 g tablet Take 1 tablet (1 g total) by mouth 2 (two) times daily for 14 days. 01/17/18 01/31/18  Willy Eddy, MD  sucralfate (CARAFATE) 1 g tablet Take 1 tablet (1 g total) by mouth 4 (four) times daily. 11/04/18   Phineas Semen, MD  zolpidem (AMBIEN) 5 MG tablet Take 5 mg by mouth at bedtime as needed for sleep.    [provider]    Family History Family History  Problem Relation Age of Onset   Diabetes Father    Heart disease Father    Breast cancer Neg Hx     Social History Social History   Tobacco Use   Smoking status: Former    Current packs/day: 0.50    Average packs/day: 0.5 packs/day for 18.0 years (9.0 ttl pk-yrs)     Types: Cigarettes   Smokeless tobacco: Never  Vaping Use   Vaping status: Never Used  Substance Use Topics   Alcohol use: No   Drug use: No     Allergies   Zofran [ondansetron hcl], Codeine, Neomycin, and Vicodin [hydrocodone-acetaminophen]   Review of Systems Review of Systems  Constitutional:  Negative for fatigue and fever.  Skin:  Positive for color change.     Physical Exam Triage Vital Signs ED Triage Vitals  Encounter Vitals Group     BP 11/30/22 1716 (!) 154/86     Systolic BP Percentile --      Diastolic BP Percentile --      Pulse Rate 11/30/22 1716 81     Resp --      Temp 11/30/22 1716 98.4 F (36.9 C)     Temp Source 11/30/22 1716 Oral     SpO2 11/30/22 1716 96 %     Weight 11/30/22 1713 220 lb (99.8 kg)     Height 11/30/22 1713 5\' 5"  (1.651 m)     Head Circumference --      Peak Flow --      Pain Score 11/30/22 1713 1     Pain Loc --      Pain Education --      Exclude from Growth Chart --    No data found.  Updated Vital Signs BP (!) 154/86 (BP Location: Left Arm)   Pulse 81   Temp 98.4 F (36.9 C) (Oral)   Ht 5\' 5"  (1.651 m)   Wt 220 lb (99.8 kg)   LMP 04/28/2021 (Approximate)   SpO2 96%   BMI 36.61 kg/m       Physical Exam Vitals and nursing note reviewed.  Constitutional:      General: She is not in acute distress.    Appearance: Normal appearance. She is not ill-appearing or toxic-appearing.  HENT:     Head: Normocephalic and atraumatic.  Eyes:     General: No scleral icterus.       Right eye: No discharge.        Left eye: No discharge.     Conjunctiva/sclera: Conjunctivae normal.  Cardiovascular:     Rate and Rhythm: Normal rate and regular rhythm.  Pulmonary:     Effort: Pulmonary effort is normal. No respiratory distress.  Musculoskeletal:  Cervical back: Neck supple.  Skin:    General: Skin is dry.     Comments: BUTTOCKS: There is a small pustule without induration/fluctuance left buttocks (inner). It is  draining pus. It is TTP.   Neurological:     General: No focal deficit present.     Mental Status: She is alert. Mental status is at baseline.     Motor: No weakness.     Gait: Gait normal.  Psychiatric:        Mood and Affect: Mood normal.        Behavior: Behavior normal.        Thought Content: Thought content normal.      UC Treatments / Results  Labs (all labs ordered are listed, but only abnormal results are displayed) Labs Reviewed - No data to display  EKG   Radiology No results found.  Procedures Procedures (including critical care time)  Medications Ordered in UC Medications - No data to display  Initial Impression / Assessment and Plan / UC Course  I have reviewed the triage vital signs and the nursing notes.  Pertinent labs & imaging results that were available during my care of the patient were reviewed by me and considered in my medical decision making (see chart for details).   52 year old female presents for an area of pain, swelling and drainage of the left buttocks for the past few days.  Husband has noticed pustular drainage.  On exam she has a pustule which is draining.  Likely folliculitis/infected hair.  Sent doxycycline to pharmacy.  Discussed warm compresses.  Also prescribed mupirocin ointment.  Reviewed return precautions.   Final Clinical Impressions(s) / UC Diagnoses   Final diagnoses:  Folliculitis     Discharge Instructions      -Mild infection.  Use warm compresses on the area which may help it drain more. - I sent antibiotics to the pharmacy as well as an ointment. - This should be getting better in the next couple days. - If symptoms worsen please return for reevaluation.     ED Prescriptions     Medication Sig Dispense Auth. Provider   doxycycline (VIBRAMYCIN) 100 MG capsule Take 1 capsule (100 mg total) by mouth 2 (two) times daily for 7 days. 14 capsule Eusebio Friendly B, PA-C   mupirocin ointment (BACTROBAN) 2 % Apply 1  Application topically 2 (two) times daily. 22 g Shirlee Latch, PA-C      PDMP not reviewed this encounter.   Shirlee Latch, PA-C 11/30/22 1745

## 2022-11-30 NOTE — ED Triage Notes (Signed)
Pt c/o lump along pt rear x5days  Pt states that the lump is painful when sitting.   Pt does not know if it is draining.

## 2022-11-30 NOTE — Discharge Instructions (Signed)
-  Mild infection.  Use warm compresses on the area which may help it drain more. - I sent antibiotics to the pharmacy as well as an ointment. - This should be getting better in the next couple days. - If symptoms worsen please return for reevaluation.

## 2022-12-01 ENCOUNTER — Telehealth: Payer: Self-pay | Admitting: Physician Assistant

## 2022-12-01 MED ORDER — SULFAMETHOXAZOLE-TRIMETHOPRIM 800-160 MG PO TABS: 1.0000 | ORAL_TABLET | Freq: Two times a day (BID) | ORAL | 0 refills | Status: AC

## 2022-12-01 NOTE — Telephone Encounter (Signed)
Patient called with complaints of leg pain. Stated that she took doxy that was rx'd and woke up with kegs cramps. Pt asked for abx to be changed. Sent to PA

## 2022-12-01 NOTE — Telephone Encounter (Signed)
Patient reporting waking up with aching calves after starting doxycycline yesterday for infection.  Advised patient she could give a 24 hours and see if symptoms improve or we can switch her antibiotic.  Patient would like to have her antibiotic switched in case it is related to that.  Advised discontinuing doxycycline and starting Bactrim DS.

## 2023-02-04 ENCOUNTER — Encounter: Payer: Self-pay | Admitting: *Deleted

## 2023-02-04 ENCOUNTER — Ambulatory Visit
Admission: EM | Admit: 2023-02-04 | Discharge: 2023-02-04 | Disposition: A | Discharge status: Home / Self Care | Payer: Self-pay

## 2023-02-04 DIAGNOSIS — T23212A Burn of second degree of left thumb (nail), initial encounter: Secondary | ICD-10-CM

## 2023-02-04 NOTE — ED Triage Notes (Signed)
Patient states she was using a hot glue gun and glue got stuck to left thumb and she had to pull it off, skin is puckered but intact.

## 2023-02-04 NOTE — Discharge Instructions (Signed)
-  Elevate extremity.  Take ibuprofen for inflammation and pain.  Can also take Tylenol. - Clean with soap and water daily.  Do not pop the blister.  If it opens and pops on its own then clean with soap and water and apply Neosporin. - Consider covering with a Band-Aid to protect it. - If you notice increased redness, increased swelling, worsening pain should be seen again for possible infection. - We discussed trying aloe to help cool the skin.  There is also Bengay topical lidocaine gel you can purchase over-the-counter.

## 2023-02-04 NOTE — ED Provider Notes (Signed)
[facility]    CSN: [id number] Arrival date & time: 02/04/23  1800      History   Chief Complaint Chief Complaint  Patient presents with   Finger Injury    HPI Gabriella SOLOMON is a 52 y.o. female presenting for left thumb pain that started after she got hot glue on her thumb while crafting this evening. Has been soaking finger in cool water. Reports a lot of pain of the thumb and some swelling. States she burned the tips of the fingers on the right hand picking the hot glue off of the left thumb.   HPI  Past Medical History:  Diagnosis Date   Cholelithiasis    Depression    GERD (gastroesophageal reflux disease)    Heart palpitations    Hypertension    Muscle spasm    PTSD (post-traumatic stress disorder)    Thyroid disease    Hypothyroidism   Vertigo     Patient Active Problem List   Diagnosis Date Noted   Gallstones 11/02/2013    Past Surgical History:  Procedure Laterality Date   BREAST BIOPSY Left 2006   neg- done in Dr. Purvis [place] office   BREAST LUMPECTOMY  2006   CESAREAN SECTION  00,2006   TUBAL LIGATION  2006    OB History     Gravida  2   Para  2   Term      Preterm      AB      Living  2      SAB      IAB      Ectopic      Multiple      Live Births           Obstetric Comments  1st Menstrual Cycle:  14 1st Pregnancy:  28          Home Medications    Prior to Admission medications   Medication Sig Start Date End Date Taking? Authorizing Provider  amLODipine (NORVASC) 5 MG tablet Take 5 mg by mouth daily. 11/15/21   [provider]  benzonatate (TESSALON) 200 MG capsule Take 1 capsule (200 mg total) by mouth 3 (three) times daily as needed for cough. 07/02/17   Renford Dills, NP  busPIRone (BUSPAR) 7.5 MG tablet Take 7.5 mg by mouth 3 (three) times daily.    [provider]  butalbital-acetaminophen-caffeine (FIORICET WITH CODEINE) 50-325-40-30 MG capsule Take 1-2 capsules by mouth every  4 (four) hours as needed for headache.    [provider]  hydrOXYzine (ATARAX/VISTARIL) 50 MG tablet Take 25-50 mg by mouth as needed.    [provider]  ipratropium (ATROVENT) 0.06 % nasal spray Place 2 sprays into both nostrils 4 (four) times daily. 05/27/21   Becky Augusta, NP  levothyroxine (SYNTHROID, LEVOTHROID) 125 MCG tablet Take 1 tablet by mouth daily.    [provider]  lidocaine (XYLOCAINE) 2 % solution Use as directed 15 mLs in the mouth or throat as needed for mouth pain. 06/22/22   Katha Cabal, DO  meclizine (ANTIVERT) 12.5 MG tablet Take 1 tablet (12.5 mg total) by mouth 3 (three) times daily as needed for dizziness. 11/24/21   Becky Augusta, NP  mupirocin ointment (BACTROBAN) 2 % Apply 1 Application topically 2 (two) times daily. 11/30/22   Shirlee Latch, PA-C  oseltamivir (TAMIFLU) 75 MG capsule Take 1 capsule (75 mg total) by mouth every 12 (twelve) hours. 03/30/22   Immordino, Jeannett Senior, FNP  pantoprazole (PROTONIX) 40 MG tablet Take 40 mg by mouth daily.    [provider]  ranitidine (ZANTAC) 150 MG tablet Take 150 mg by mouth 2 (two) times daily.    [provider]  sucralfate (CARAFATE) 1 g tablet Take 1 tablet (1 g total) by mouth 2 (two) times daily for 14 days. 01/17/18 01/31/18  Willy Eddy, MD  sucralfate (CARAFATE) 1 g tablet Take 1 tablet (1 g total) by mouth 4 (four) times daily. 11/04/18   Phineas Semen, MD  zolpidem (AMBIEN) 5 MG tablet Take 5 mg by mouth at bedtime as needed for sleep.    [provider]    Family History Family History  Problem Relation Age of Onset   Diabetes Father    Heart disease Father    Breast cancer Neg Hx     Social History Social History   Tobacco Use   Smoking status: Former    Current packs/day: 0.50    Average packs/day: 0.5 packs/day for 18.0 years (9.0 ttl pk-yrs)    Types: Cigarettes   Smokeless tobacco: Never  Vaping Use   Vaping status: Never Used   Substance Use Topics   Alcohol use: No   Drug use: No     Allergies   Zofran [ondansetron hcl], Codeine, Neomycin, and Vicodin [hydrocodone-acetaminophen]   Review of Systems Review of Systems  Musculoskeletal:  Negative for arthralgias and joint swelling.  Skin:  Positive for color change. Negative for wound.  Neurological:  Negative for weakness and numbness.     Physical Exam Triage Vital Signs ED Triage Vitals  Encounter Vitals Group     BP 02/04/23 1837 129/72     Systolic BP Percentile --      Diastolic BP Percentile --      Pulse Rate 02/04/23 1837 71     Resp 02/04/23 1837 18     Temp 02/04/23 1837 98.2 F (36.8 C)     Temp Source 02/04/23 1837 Oral     SpO2 02/04/23 1837 98 %     Weight 02/04/23 1833 220 lb (99.8 kg)     Height 02/04/23 1833 5\' 5"  (1.651 m)     Head Circumference --      Peak Flow --      Pain Score 02/04/23 1833 2     Pain Loc --      Pain Education --      Exclude from Growth Chart --    No data found.  Updated Vital Signs BP 129/72 (BP Location: Right Arm)   Pulse 71   Temp 98.2 F (36.8 C) (Oral)   Resp 18   Ht 5\' 5"  (1.651 m)   Wt 220 lb (99.8 kg)   LMP 04/28/2021 (Approximate)   SpO2 98%   BMI 36.61 kg/m     Physical Exam Vitals and nursing note reviewed.  Constitutional:      General: She is not in acute distress.    Appearance: Normal appearance. She is not ill-appearing or toxic-appearing.  HENT:     Head: Normocephalic and atraumatic.  Eyes:     Conjunctiva/sclera: Conjunctivae normal.  Cardiovascular:     Rate and Rhythm: Normal rate.  Pulmonary:     Effort: Pulmonary effort is normal. No respiratory distress.  Musculoskeletal:     Cervical back: Neck supple.  Skin:    General: Skin is dry.     Findings: Burn and erythema present.     Comments: LEFT THUMB: Large bulla  distal thumb pad with mild erythema.   RIGHT HAND: Slight erythema second, third and fourth fingertips  Neurological:     General: No  focal deficit present.     Mental Status: She is alert. Mental status is at baseline.     Motor: No weakness.     Gait: Gait normal.  Psychiatric:        Mood and Affect: Mood normal.      UC Treatments / Results  Labs (all labs ordered are listed, but only abnormal results are displayed) Labs Reviewed - No data to display  EKG   Radiology No results found.  Procedures Procedures (including critical care time)  Medications Ordered in UC Medications - No data to display  Initial Impression / Assessment and Plan / UC Course  I have reviewed the triage vital signs and the nursing notes.  Pertinent labs & imaging results that were available during my care of the patient were reviewed by me and considered in my medical decision making (see chart for details).   52 year old female presents for hot glue gun burn of the left thumb that occurred in the past hour.  Reports minimal burn of the fingertips on the right hand.  Has been soaking the thumb in cool bladder.  On exam she does have a large bulla of the left thumb consistent with second degree burn.  Discussed burn care with patient.  Reviewed return precautions.   Final Clinical Impressions(s) / UC Diagnoses   Final diagnoses:  Partial thickness burn of left thumb, initial encounter     Discharge Instructions      -Elevate extremity.  Take ibuprofen for inflammation and pain.  Can also take Tylenol. - Clean with soap and water daily.  Do not pop the blister.  If it opens and pops on its own then clean with soap and water and apply Neosporin. - Consider covering with a Band-Aid to protect it. - If you notice increased redness, increased swelling, worsening pain should be seen again for possible infection. - We discussed trying aloe to help cool the skin.  There is also Bengay topical lidocaine gel you can purchase over-the-counter.     ED Prescriptions   None    PDMP not reviewed this encounter.   Shirlee Latch, PA-C 02/04/23 1901

## 2023-03-21 ENCOUNTER — Encounter: Payer: Self-pay | Admitting: Emergency Medicine

## 2023-03-21 ENCOUNTER — Ambulatory Visit
Admission: EM | Admit: 2023-03-21 | Discharge: 2023-03-21 | Disposition: A | Discharge status: Home / Self Care | Payer: Self-pay | Attending: Emergency Medicine | Admitting: Emergency Medicine

## 2023-03-21 DIAGNOSIS — T23222A Burn of second degree of single left finger (nail) except thumb, initial encounter: Secondary | ICD-10-CM

## 2023-03-21 MED ORDER — SULFAMETHOXAZOLE-TRIMETHOPRIM 800-160 MG PO TABS: 1.0000 | ORAL_TABLET | Freq: Two times a day (BID) | ORAL | 0 refills | Status: AC

## 2023-03-21 NOTE — ED Provider Notes (Signed)
[facility]    CSN: [id number] Arrival date & time: 03/21/23  1648      History   Chief Complaint Chief Complaint  Patient presents with   Burn    HPI Gabriella Chambers is a 52 y.o. female.   HPI  52 year old female with past medical history significant for vertigo, hypothyroidism, PTSD, hypertension, heart palpitations, GERD, and depression presents for evaluation of a burn she sustained to the tip of her left fourth finger 5 days ago from a hot glue gun.  She reports that the glue stuck to her skin and she wanted pulling Doppler skin off when she remove the glue.  She has been using over-the-counter wound dressing of some kind but she is concerned because she has developed increased swelling and redness around the burn.  No drainage or fever.  Past Medical History:  Diagnosis Date   Cholelithiasis    Depression    GERD (gastroesophageal reflux disease)    Heart palpitations    Hypertension    Muscle spasm    PTSD (post-traumatic stress disorder)    Thyroid disease    Hypothyroidism   Vertigo     Patient Active Problem List   Diagnosis Date Noted   Gallstones 11/02/2013    Past Surgical History:  Procedure Laterality Date   BREAST BIOPSY Left 2006   neg- done in Dr. Purvis [place] office   BREAST LUMPECTOMY  2006   CESAREAN SECTION  00,2006   TUBAL LIGATION  2006    OB History     Gravida  2   Para  2   Term      Preterm      AB      Living  2      SAB      IAB      Ectopic      Multiple      Live Births           Obstetric Comments  1st Menstrual Cycle:  14 1st Pregnancy:  28          Home Medications    Prior to Admission medications   Medication Sig Start Date End Date Taking? Authorizing Provider  sulfamethoxazole-trimethoprim (BACTRIM DS) 800-160 MG tablet Take 1 tablet by mouth 2 (two) times daily for 7 days. 03/21/23 03/28/23 Yes Becky Augusta, NP  amLODipine (NORVASC) 5 MG tablet Take 5 mg by mouth daily.  11/15/21   [provider]  benzonatate (TESSALON) 200 MG capsule Take 1 capsule (200 mg total) by mouth 3 (three) times daily as needed for cough. 07/02/17   Renford Dills, NP  busPIRone (BUSPAR) 7.5 MG tablet Take 7.5 mg by mouth 3 (three) times daily.    [provider]  butalbital-acetaminophen-caffeine (FIORICET WITH CODEINE) 50-325-40-30 MG capsule Take 1-2 capsules by mouth every 4 (four) hours as needed for headache.    [provider]  hydrOXYzine (ATARAX/VISTARIL) 50 MG tablet Take 25-50 mg by mouth as needed.    [provider]  ipratropium (ATROVENT) 0.06 % nasal spray Place 2 sprays into both nostrils 4 (four) times daily. 05/27/21   Becky Augusta, NP  levothyroxine (SYNTHROID, LEVOTHROID) 125 MCG tablet Take 1 tablet by mouth daily.    [provider]  lidocaine (XYLOCAINE) 2 % solution Use as directed 15 mLs in the mouth or throat as needed for mouth pain. 06/22/22   Katha Cabal, DO  meclizine (ANTIVERT) 12.5 MG tablet Take 1 tablet (12.5 mg total) by  mouth 3 (three) times daily as needed for dizziness. 11/24/21   Becky Augusta, NP  mupirocin ointment (BACTROBAN) 2 % Apply 1 Application topically 2 (two) times daily. 11/30/22   Shirlee Latch, PA-C  oseltamivir (TAMIFLU) 75 MG capsule Take 1 capsule (75 mg total) by mouth every 12 (twelve) hours. 03/30/22   Immordino, Jeannett Senior, FNP  pantoprazole (PROTONIX) 40 MG tablet Take 40 mg by mouth daily.    [provider]  ranitidine (ZANTAC) 150 MG tablet Take 150 mg by mouth 2 (two) times daily.    [provider]  sucralfate (CARAFATE) 1 g tablet Take 1 tablet (1 g total) by mouth 2 (two) times daily for 14 days. 01/17/18 01/31/18  Willy Eddy, MD  sucralfate (CARAFATE) 1 g tablet Take 1 tablet (1 g total) by mouth 4 (four) times daily. 11/04/18   Phineas Semen, MD  zolpidem (AMBIEN) 5 MG tablet Take 5 mg by mouth at bedtime as needed for sleep.    [provider]     Family History Family History  Problem Relation Age of Onset   Diabetes Father    Heart disease Father    Breast cancer Neg Hx     Social History Social History   Tobacco Use   Smoking status: Former    Current packs/day: 0.50    Average packs/day: 0.5 packs/day for 18.0 years (9.0 ttl pk-yrs)    Types: Cigarettes   Smokeless tobacco: Never  Vaping Use   Vaping status: Never Used  Substance Use Topics   Alcohol use: No   Drug use: No     Allergies   Zofran [ondansetron hcl], Codeine, Neomycin, and Vicodin [hydrocodone-acetaminophen]   Review of Systems Review of Systems  Constitutional:  Negative for fever.  Skin:  Positive for color change and wound.     Physical Exam Triage Vital Signs ED Triage Vitals  Encounter Vitals Group     BP      Systolic BP Percentile      Diastolic BP Percentile      Pulse      Resp      Temp      Temp src      SpO2      Weight      Height      Head Circumference      Peak Flow      Pain Score      Pain Loc      Pain Education      Exclude from Growth Chart    No data found.  Updated Vital Signs BP 125/85 (BP Location: Right Arm)   Pulse 72   Temp 98.3 F (36.8 C) (Oral)   Resp 16   LMP 04/28/2021 (Approximate)   SpO2 95%   Visual Acuity Right Eye Distance:   Left Eye Distance:   Bilateral Distance:    Right Eye Near:   Left Eye Near:    Bilateral Near:     Physical Exam Vitals and nursing note reviewed.  Constitutional:      Appearance: Normal appearance. She is not ill-appearing.  HENT:     Head: Normocephalic and atraumatic.  Musculoskeletal:        General: Swelling, tenderness and signs of injury present.  Skin:    General: Skin is warm and dry.     Capillary Refill: Capillary refill takes less than 2 seconds.     Findings: Erythema present.  Neurological:     General: No focal  deficit present.     Mental Status: She is alert and oriented to person, place, and time.      UC  Treatments / Results  Labs (all labs ordered are listed, but only abnormal results are displayed) Labs Reviewed - No data to display  EKG   Radiology No results found.  Procedures Procedures (including critical care time)  Medications Ordered in UC Medications - No data to display  Initial Impression / Assessment and Plan / UC Course  I have reviewed the triage vital signs and the nursing notes.  Pertinent labs & imaging results that were available during my care of the patient were reviewed by me and considered in my medical decision making (see chart for details).   Patient is very pleasant, nontoxic-appearing 52 year old female presenting for evaluation of a burn she sustained to the distal, dorsal aspect of her left fourth finger 5 days ago.  As you can see, the scab has formed over the wound bed but there is some surrounding erythema and mild edema.  This erythema is warm to touch as well as tender.  Patient's cap refills less than 2 seconds.  I suspicious that patient is developing some cellulitis secondary to the burn and I will discharge her home on Bactrim DS twice daily for 7 days to cover for developing cellulitis.  I advised her to remove her wedding set in case her finger swells more to prevent return to getting and having to have the wedding set cut off.  We also discussed that if she has any increased swelling, red streaks going up her finger, or she starts running fevers that she needs to be evaluated in the emergency department.  Final Clinical Impressions(s) / UC Diagnoses   Final diagnoses:  Second degree burn of finger of left hand, initial encounter     Discharge Instructions      Keep the burn clean and dry and covered with a dressing when you are out in public.  You may leave it open to air when you are at home.  Take the Bactrim DS twice daily with a full glass of water for 7 days to treat the secondary infection stemming from the burn.  You may use  over-the-counter Tylenol and or ibuprofen according the package instructions as needed for pain.  If you develop any increased redness, swelling, red streaks going up your finger, pus drainage, or if you start running fevers you need to be reevaluated in the emergency department.     ED Prescriptions     Medication Sig Dispense Auth. Provider   sulfamethoxazole-trimethoprim (BACTRIM DS) 800-160 MG tablet Take 1 tablet by mouth 2 (two) times daily for 7 days. 14 tablet Becky Augusta, NP      PDMP not reviewed this encounter.   Becky Augusta, NP 03/21/23 1737

## 2023-03-21 NOTE — ED Triage Notes (Signed)
Pt presents with a burn to her left ring finger with a hot glue gun 5 days ago.

## 2023-03-21 NOTE — Discharge Instructions (Addendum)
Keep the burn clean and dry and covered with a dressing when you are out in public.  You may leave it open to air when you are at home.  Take the Bactrim DS twice daily with a full glass of water for 7 days to treat the secondary infection stemming from the burn.  You may use over-the-counter Tylenol and or ibuprofen according the package instructions as needed for pain.  If you develop any increased redness, swelling, red streaks going up your finger, pus drainage, or if you start running fevers you need to be reevaluated in the emergency department.

## 2023-03-24 ENCOUNTER — Telehealth: Payer: Self-pay

## 2023-03-24 ENCOUNTER — Telehealth: Payer: Self-pay | Admitting: Physician Assistant

## 2023-03-24 MED ORDER — CEPHALEXIN 500 MG PO CAPS: 500.0000 mg | ORAL_CAPSULE | Freq: Four times a day (QID) | ORAL | 0 refills | Status: AC

## 2023-03-24 MED ORDER — CEPHALEXIN 500 MG PO CAPS: 500.0000 mg | ORAL_CAPSULE | Freq: Four times a day (QID) | ORAL | 0 refills | Status: DC

## 2023-03-24 NOTE — Telephone Encounter (Signed)
Patient reporting reaction to Bactrim DS. Reports headaches. She was given this for cellulitis 3 days ago. Will change to Keflex.

## 2023-11-12 ENCOUNTER — Ambulatory Visit
Admission: EM | Admit: 2023-11-12 | Discharge: 2023-11-12 | Disposition: A | Discharge status: Home / Self Care | Payer: Self-pay | Attending: Emergency Medicine | Admitting: Emergency Medicine

## 2023-11-12 ENCOUNTER — Encounter: Payer: Self-pay | Admitting: Emergency Medicine

## 2023-11-12 DIAGNOSIS — K047 Periapical abscess without sinus: Secondary | ICD-10-CM

## 2023-11-12 DIAGNOSIS — K029 Dental caries, unspecified: Secondary | ICD-10-CM

## 2023-11-12 MED ORDER — IBUPROFEN 600 MG PO TABS: 600.0000 mg | ORAL_TABLET | Freq: Four times a day (QID) | ORAL | 0 refills | Status: AC | PRN

## 2023-11-12 MED ORDER — AMOXICILLIN-POT CLAVULANATE 875-125 MG PO TABS: 1.0000 | ORAL_TABLET | Freq: Two times a day (BID) | ORAL | 0 refills | Status: DC

## 2023-11-12 NOTE — ED Provider Notes (Signed)
 HPI  SUBJECTIVE:  Gabriella Chambers is a 53 y.o. female who presents with 2 days of throbbing, constant right lower dental pain, facial swelling starting yesterday.  She has a known bad tooth that had a root canal.  She states that the crown broke 7 months ago.  She reports sensitivity to temperature.  No fevers, drooling, trismus, swelling under the tongue or jaw, neck stiffness, gingival swelling, purulent drainage.  She has been taking ibuprofen  800 mg every 8 hours with relief in her symptoms.  Last dose was within the past 6 hours.  Symptoms are worse with eating and with exposure to cold temperature.  She has a past medical history of GERD, hypertension, hypothyroidism, palpitations, anxiety.  PCP: [facility].  Dentist: None.  Past Medical History:  Diagnosis Date   Cholelithiasis    Depression    GERD (gastroesophageal reflux disease)    Heart palpitations    Hypertension    Muscle spasm    PTSD (post-traumatic stress disorder)    Thyroid disease    Hypothyroidism   Vertigo     Past Surgical History:  Procedure Laterality Date   BREAST BIOPSY Left 2006   neg- done in Dr. Harry office   BREAST LUMPECTOMY  2006   CESAREAN SECTION  00,2006   TUBAL LIGATION  2006    Family History  Problem Relation Age of Onset   Diabetes Father    Heart disease Father    Breast cancer Neg Hx     Social History   Tobacco Use   Smoking status: Former    Current packs/day: 0.50    Average packs/day: 0.5 packs/day for 18.0 years (9.0 ttl pk-yrs)    Types: Cigarettes   Smokeless tobacco: Never  Vaping Use   Vaping status: Never Used  Substance Use Topics   Alcohol use: No   Drug use: No    No current facility-administered medications for this encounter.  Current Outpatient Medications:    amoxicillin -clavulanate (AUGMENTIN ) 875-125 MG tablet, Take 1 tablet by mouth every 12 (twelve) hours., Disp: 14 tablet, Rfl: 0   ibuprofen  (ADVIL ) 600 MG tablet, Take 1 tablet (600 mg  total) by mouth every 6 (six) hours as needed., Disp: 30 tablet, Rfl: 0   amLODipine (NORVASC) 5 MG tablet, Take 5 mg by mouth daily., Disp: , Rfl:    busPIRone (BUSPAR) 7.5 MG tablet, Take 7.5 mg by mouth 3 (three) times daily., Disp: , Rfl:    butalbital-acetaminophen -caffeine (FIORICET WITH CODEINE) 50-325-40-30 MG capsule, Take 1-2 capsules by mouth every 4 (four) hours as needed for headache., Disp: , Rfl:    hydrOXYzine (ATARAX/VISTARIL) 50 MG tablet, Take 25-50 mg by mouth as needed., Disp: , Rfl:    levothyroxine (SYNTHROID, LEVOTHROID) 125 MCG tablet, Take 1 tablet by mouth daily., Disp: , Rfl:    lidocaine  (XYLOCAINE ) 2 % solution, Use as directed 15 mLs in the mouth or throat as needed for mouth pain., Disp: 100 mL, Rfl: 0   meclizine  (ANTIVERT ) 12.5 MG tablet, Take 1 tablet (12.5 mg total) by mouth 3 (three) times daily as needed for dizziness., Disp: 30 tablet, Rfl: 0   mupirocin  ointment (BACTROBAN ) 2 %, Apply 1 Application topically 2 (two) times daily., Disp: 22 g, Rfl: 0   pantoprazole (PROTONIX) 40 MG tablet, Take 40 mg by mouth daily., Disp: , Rfl:    ranitidine (ZANTAC) 150 MG tablet, Take 150 mg by mouth 2 (two) times daily., Disp: , Rfl:    sucralfate  (CARAFATE )  1 g tablet, Take 1 tablet (1 g total) by mouth 2 (two) times daily for 14 days., Disp: 28 tablet, Rfl: 1   sucralfate  (CARAFATE ) 1 g tablet, Take 1 tablet (1 g total) by mouth 4 (four) times daily., Disp: 60 tablet, Rfl: 0   zolpidem (AMBIEN) 5 MG tablet, Take 5 mg by mouth at bedtime as needed for sleep., Disp: , Rfl:   Allergies  Allergen Reactions   Sertraline     Other Reaction(s): Neurotoxicity with tinnitis and increased ocular movements; increased anxiety   Zofran [Ondansetron Hcl] Hives   Codeine Hives and Nausea And Vomiting   Neomycin Nausea And Vomiting   Vicodin [Hydrocodone -Acetaminophen ] Itching and Nausea And Vomiting     ROS  As noted in HPI.   Physical Exam  BP (!) 141/77 (BP Location:  Left Arm)   Pulse 87   Temp 97.9 F (36.6 C) (Oral)   Resp 16   LMP 04/28/2021 (Approximate)   SpO2 98%   Constitutional: Well developed, well nourished, no acute distress Eyes:  EOMI, conjunctiva normal bilaterally HENT: Normocephalic, atraumatic,mucus membranes moist.  Extensively poor dentition.  Right lower 1st and 2nd molar extensively decayed.  Second molar tender to palpation, with gingival swelling/tenderness.  No swelling under the tongue or jaw.  No drooling, trismus. Neck: No cervical lymphadenopathy Respiratory: Normal inspiratory effort Cardiovascular: Normal rate GI: nondistended skin: No rash, skin intact Musculoskeletal: no deformities Neurologic: Alert & oriented x 3, no focal neuro deficits Psychiatric: Speech and behavior appropriate   ED Course   Medications - No data to display  No orders of the defined types were placed in this encounter.   No results found for this or any previous visit (from the past 24 hours). No results found.  ED Clinical Impression  1. Dental infection   2. Pain due to dental caries      ED Assessment/Plan     Patient presents with a dental infection.  Home with Augmentin , ibuprofen  600 mg with sparing use of Tylenol  as needed, salt water rinses, Listerine, will provide dental list.  She declined dental block.  ER return precautions given.  Discussed  MDM, treatment plan, and plan for follow-up with patient. Discussed sn/sx that should prompt return to the ED. patient agrees with plan.   Meds ordered this encounter  Medications   amoxicillin -clavulanate (AUGMENTIN ) 875-125 MG tablet    Sig: Take 1 tablet by mouth every 12 (twelve) hours.    Dispense:  14 tablet    Refill:  0   ibuprofen  (ADVIL ) 600 MG tablet    Sig: Take 1 tablet (600 mg total) by mouth every 6 (six) hours as needed.    Dispense:  30 tablet    Refill:  0      *This clinic note was created using Scientist, clinical (histocompatibility and immunogenetics). Therefore, there may be  occasional mistakes despite careful proofreading.  ?    Van Knee, MD 11/12/23 [phone]

## 2023-11-12 NOTE — Discharge Instructions (Signed)
 Take 600 mg of ibuprofen  3-4 times a day as needed for pain.  May add 1000 mg of Tylenol  to the ibuprofen  for severe pain.  Salt water rinses, Listerine rinses to help decrease bacterial burden and rinse out the infection.  Finish the Augmentin , even if you feel better.  OPTIONS FOR DENTAL FOLLOW UP CARE  [place] Department of Health and Human Services - Local Safety Net Dental Clinics [email or web address]   [facility] [phone])  Norita Goldberg [phone])  [facility] [phone] ext [phone])  [facility] [phone])   [facility]  Richard L. Sidra, DDS  [address] [place], [place]  [address] Call Today: [phone]  [facility]ental [facility] at [facility]  [address] [place], [place] [address] Call Us : [phone]  [facility] [address]  [place], [place] [address] [phone]  [address]  [place], [place] [address] [phone]  [facility] [address]  [place], [place] [address] [phone]    [facility] [phone]) This clinic caters to the indigent population and is on a lottery system. Location: [employer] [facility], [employer], [address], [place] Clinic Hours: Wednesdays from 6pm - 9pm, patients seen by a lottery system. For dates, call or go to [email or web address] Services: Cleanings, fillings and simple extractions. Payment Options: DENTAL WORK IS FREE OF CHARGE. Bring proof of income or support. Best way to get seen: Arrive at 5:15 pm - this is a lottery, NOT first come/first serve, so arriving earlier will not increase your chances of being seen.     [facility] [phone] Select option 1 for emergencies   Location: [facility], [place], [address] [place] Clinic Hours: No walk-ins accepted -  call the day before to schedule an appointment. Check in times are 9:30 am and 1:30 pm. Services: Simple extractions, temporary fillings, pulpectomy/pulp debridement, uncomplicated abscess drainage. Payment Options: PAYMENT IS DUE AT THE TIME OF SERVICE.  Fee is usually $100-200, additional surgical procedures (e.g. abscess drainage) may be extra. Cash, checks, Visa/MasterCard accepted.  Can file Medicaid if patient is covered for dental - patient should call case worker to check. No discount for [facility] patients. Best way to get seen: MUST call the day before and get onto the schedule. Can usually be seen the next 1-2 days. No walk-ins accepted.     [facility] [phone]   Location: [facility], [address] [place] Clinic Hours: M, W, Th, F 8am or 1:30pm, Tues 9a or 1:30 - first come/first served. Services: Simple extractions, temporary fillings, uncomplicated abscess drainage.  You do not need to be an [facility] resident. Payment Options: PAYMENT IS DUE AT THE TIME OF SERVICE. Dental insurance, otherwise sliding scale - bring proof of income or support. Depending on income and treatment needed, cost is usually $50-200. Best way to get seen: Arrive early as it is first come/first served.     [facility] [phone]   Location: [address] Clinic Hours: Mon-Thu 8a-5p Services: Most basic dental services including extractions and fillings. Payment Options: PAYMENT IS DUE AT THE TIME OF SERVICE. Sliding scale, up to 50% off - bring proof if income or support. Medicaid with dental option accepted. Best way to get seen: Call to schedule an appointment, can usually be seen within 2 weeks OR they will try to see walk-ins - show up  at 8a or 2p (you may have to wait).     [facility] [phone] [place] RESIDENTS ONLY   Location: [facility], [address], [place], [place] [address] Clinic Hours: By appointment only. Monday - Thursday 8am-5pm, Friday 8am-12pm Services: Cleanings, fillings, extractions. Payment Options: PAYMENT IS DUE AT THE TIME OF SERVICE. Cash, Visa or MasterCard. Sliding scale - $30 minimum per service. Best way to get seen: Come in to office, complete packet and make an appointment - need proof of income or support monies for each household member and proof of [facility] residence. Usually takes about a month to get in.     [facility] [phone]   Location: [address], [facility] Clinic Hours: Walk-in Urgent Care Dental Services are offered Monday-Friday mornings only. The numbers of emergencies accepted daily is limited to the number of providers available. Maximum 15 - Mondays, Wednesdays & Thursdays Maximum 10 - Tuesdays & Fridays Services: You do not need to be a [facility] resident to be seen for a dental emergency. Emergencies are defined as pain, swelling, abnormal bleeding, or dental trauma. Walkins will receive x-rays if needed. NOTE: Dental cleaning is not an emergency. Payment Options: PAYMENT IS DUE AT THE TIME OF SERVICE. Minimum co-pay is $40.00 for uninsured patients. Minimum co-pay is $3.00 for Medicaid with dental coverage. Dental Insurance is accepted and must be presented at time of visit. Medicare does not cover dental. Forms of payment: Cash, credit card, checks. Best way to get seen: If not previously registered with the clinic, walk-in dental registration begins at 7:15 am and is on a first come/first serve basis. If previously registered with the clinic, call to make an appointment.     [facility] [phone] [place] RESIDENTS ONLY   Location: [address] [place], [place] Clinic Hours: Mon-Thu 10a-2p Services: Extractions only! Payment Options: FREE (donations accepted) - bring  proof of income or support Best way to get seen: Call and schedule an appointment OR come at 8am on the 1st Monday of every month (except for holidays) when it is first come/first served.     [facility] [phone]   Location: [address] [place], [place] Clinic Hours: Friday mornings Services, Payment Options, Best way to get seen: Call for info

## 2023-11-12 NOTE — ED Triage Notes (Signed)
 Pt presents with right side dental pain x 2 days.

## 2024-05-25 ENCOUNTER — Ambulatory Visit
Admission: EM | Admit: 2024-05-25 | Discharge: 2024-05-25 | Disposition: A | Discharge status: Home / Self Care | Payer: Self-pay | Attending: Physician Assistant | Admitting: Physician Assistant

## 2024-05-25 DIAGNOSIS — R0981 Nasal congestion: Secondary | ICD-10-CM

## 2024-05-25 DIAGNOSIS — J019 Acute sinusitis, unspecified: Secondary | ICD-10-CM

## 2024-05-25 MED ORDER — AMOXICILLIN-POT CLAVULANATE 875-125 MG PO TABS: 1.0000 | ORAL_TABLET | Freq: Two times a day (BID) | ORAL | 0 refills | Status: AC

## 2024-05-25 NOTE — ED Provider Notes (Signed)
 " [facility]    CSN: [id number] Arrival date & time: 05/25/24  1310      History   Chief Complaint No chief complaint on file.   HPI Gabriella Chambers is a 54 y.o. female presenting for 10 day history of nasal congestion, sinus pressure, and cough.  She says that she felt better until a couple days ago. Now she reports a lot of pain of the left maxillary sinus and pain into her upper teeth.  Increased pain when she leans forward and dizziness. Denies fever or breathing difficulty.  Symptoms worsening. She believes she may have a sinus infection.   HPI  Past Medical History:  Diagnosis Date   Cholelithiasis    Depression    GERD (gastroesophageal reflux disease)    Heart palpitations    Hypertension    Muscle spasm    PTSD (post-traumatic stress disorder)    Thyroid disease    Hypothyroidism   Vertigo     Patient Active Problem List   Diagnosis Date Noted   Gallstones 11/02/2013    Past Surgical History:  Procedure Laterality Date   BREAST BIOPSY Left 2006   neg- done in Dr. Harry office   BREAST LUMPECTOMY  2006   CESAREAN SECTION  00,2006   TUBAL LIGATION  2006    OB History     Gravida  2   Para  2   Term      Preterm      AB      Living  2      SAB      IAB      Ectopic      Multiple      Live Births           Obstetric Comments  1st Menstrual Cycle:  14 1st Pregnancy:  28          Home Medications    Prior to Admission medications  Medication Sig Start Date End Date Taking? Authorizing Provider  amoxicillin -clavulanate (AUGMENTIN ) 875-125 MG tablet Take 1 tablet by mouth every 12 (twelve) hours for 7 days. 05/25/24 06/01/24 Yes Arvis Huxley B, PA-C  amLODipine (NORVASC) 5 MG tablet Take 5 mg by mouth daily. 11/15/21   [provider]  busPIRone (BUSPAR) 7.5 MG tablet Take 7.5 mg by mouth 3 (three) times daily.    [provider]  butalbital-acetaminophen -caffeine (FIORICET WITH CODEINE)  50-325-40-30 MG capsule Take 1-2 capsules by mouth every 4 (four) hours as needed for headache.    [provider]  hydrOXYzine (ATARAX/VISTARIL) 50 MG tablet Take 25-50 mg by mouth as needed.    [provider]  ibuprofen  (ADVIL ) 600 MG tablet Take 1 tablet (600 mg total) by mouth every 6 (six) hours as needed. 11/12/23   Mortenson, Ashley, MD  levothyroxine (SYNTHROID, LEVOTHROID) 125 MCG tablet Take 1 tablet by mouth daily.    [provider]  lidocaine  (XYLOCAINE ) 2 % solution Use as directed 15 mLs in the mouth or throat as needed for mouth pain. 06/22/22   Brimage, Vondra, DO  meclizine  (ANTIVERT ) 12.5 MG tablet Take 1 tablet (12.5 mg total) by mouth 3 (three) times daily as needed for dizziness. 11/24/21   Bernardino Ditch, NP  mupirocin  ointment (BACTROBAN ) 2 % Apply 1 Application topically 2 (two) times daily. 11/30/22   Arvis Huxley NOVAK, PA-C  pantoprazole (PROTONIX) 40 MG tablet Take 40 mg by mouth daily.    [provider]  ranitidine (ZANTAC) 150  MG tablet Take 150 mg by mouth 2 (two) times daily.    [provider]  sucralfate  (CARAFATE ) 1 g tablet Take 1 tablet (1 g total) by mouth 2 (two) times daily for 14 days. 01/17/18 01/31/18  Lang Dover, MD  sucralfate  (CARAFATE ) 1 g tablet Take 1 tablet (1 g total) by mouth 4 (four) times daily. 11/04/18   Goodman, Graydon, MD  zolpidem (AMBIEN) 5 MG tablet Take 5 mg by mouth at bedtime as needed for sleep.    [provider]    Family History Family History  Problem Relation Age of Onset   Diabetes Father    Heart disease Father    Breast cancer Neg Hx     Social History Social History   Tobacco Use   Smoking status: Former    Current packs/day: 0.50    Average packs/day: 0.5 packs/day for 18.0 years (9.0 ttl pk-yrs)    Types: Cigarettes   Smokeless tobacco: Never  Vaping Use   Vaping status: Never Used  Substance Use Topics   Alcohol use: No   Drug use: No     Allergies    Sertraline, Zofran [ondansetron hcl], Codeine, Neomycin, and Vicodin [hydrocodone -acetaminophen ]   Review of Systems Review of Systems  Constitutional:  Positive for fatigue. Negative for chills, diaphoresis and fever.  HENT:  Positive for congestion, rhinorrhea, sinus pressure and sinus pain. Negative for ear pain and sore throat.   Respiratory:  Positive for cough. Negative for shortness of breath.   Cardiovascular:  Negative for chest pain.  Gastrointestinal:  Negative for abdominal pain, nausea and vomiting.  Musculoskeletal:  Negative for arthralgias and myalgias.  Skin:  Negative for rash.  Neurological:  Negative for weakness and headaches.  Hematological:  Negative for adenopathy.     Physical Exam Triage Vital Signs  No data found.  Updated Vital Signs BP (!) 140/85 (BP Location: Right Arm)   Pulse 90   Temp 98.6 F (37 C) (Oral)   Resp 16   LMP 04/28/2021   SpO2 96%     Physical Exam Vitals and nursing note reviewed.  Constitutional:      General: She is not in acute distress.    Appearance: Normal appearance. She is not ill-appearing or toxic-appearing.  HENT:     Head: Normocephalic and atraumatic.     Right Ear: Tympanic membrane, ear canal and external ear normal.     Left Ear: Tympanic membrane, ear canal and external ear normal.     Nose: Congestion present.     Left Sinus: Maxillary sinus tenderness present.     Mouth/Throat:     Mouth: Mucous membranes are moist.     Pharynx: Oropharynx is clear.  Eyes:     General: No scleral icterus.       Right eye: No discharge.        Left eye: No discharge.     Conjunctiva/sclera: Conjunctivae normal.  Cardiovascular:     Rate and Rhythm: Normal rate and regular rhythm.     Heart sounds: Normal heart sounds.  Pulmonary:     Effort: Pulmonary effort is normal. No respiratory distress.     Breath sounds: Normal breath sounds.  Musculoskeletal:     Cervical back: Neck supple.  Skin:    General: Skin  is dry.  Neurological:     General: No focal deficit present.     Mental Status: She is alert. Mental status is at baseline.     Motor:  No weakness.     Gait: Gait normal.  Psychiatric:        Mood and Affect: Mood normal.        Behavior: Behavior normal.      UC Treatments / Results  Labs (all labs ordered are listed, but only abnormal results are displayed) Labs Reviewed - No data to display  EKG   Radiology No results found.  Procedures Procedures (including critical care time)  Medications Ordered in UC Medications - No data to display  Initial Impression / Assessment and Plan / UC Course  I have reviewed the triage vital signs and the nursing notes.  Pertinent labs & imaging results that were available during my care of the patient were reviewed by me and considered in my medical decision making (see chart for details).   54 year old female presents for 10 day history of cough and congestion. Currently having left sinus pain and pressure.  Vitals normal and stable.  Patient overall well-appearing.  On exam she has nasal congestion.  Tenderness to palpation of the left maxillary sinus.  Chest clear auscultation heart regular rate and rhythm.  Suspect secondary acute bacterial sinusitis.  Treating with Augmentin .  Supportive care.  Follow-up as needed.   Final Clinical Impressions(s) / UC Diagnoses   Final diagnoses:  Nasal congestion  Acute sinusitis, recurrence not specified, unspecified location     Discharge Instructions      -Mucinex D, Flonase, nasal saline, Augmentin . Should be feeling better in the next 1 week. Return if new/worsening symptoms.        ED Prescriptions     Medication Sig Dispense Auth. Provider   amoxicillin -clavulanate (AUGMENTIN ) 875-125 MG tablet Take 1 tablet by mouth every 12 (twelve) hours for 7 days. 14 tablet Zakaree Mcclenahan B, PA-C      PDMP not reviewed this encounter.      Arvis Jolan NOVAK, PA-C 05/25/24 1358  "

## 2024-05-25 NOTE — Discharge Instructions (Addendum)
-  Mucinex D, Flonase, nasal saline, Augmentin. Should be feeling better in the next 1 week. Return if new/worsening symptoms.

## 2024-05-25 NOTE — ED Triage Notes (Signed)
 Patient presents to UC for HA, sinus pressure, dizziness, cough x 05/15/24. States she was seen at [facility] and negative for all the testing. Treating with rubutussin, cough drops, tylenol , and meclazine.
